# Patient Record
Sex: Female | Born: 1965 | Race: White | Hispanic: No | Marital: Married | Smoking: Current some day smoker
Health system: Southern US, Community
[De-identification: ages and names within clinical notes are randomized; demographics above are authoritative.]

## PROBLEM LIST (undated history)

## (undated) DIAGNOSIS — F419 Anxiety disorder, unspecified: Secondary | ICD-10-CM

## (undated) DIAGNOSIS — I10 Essential (primary) hypertension: Secondary | ICD-10-CM

## (undated) HISTORY — DX: Anxiety disorder, unspecified: F41.9

## (undated) HISTORY — DX: Essential (primary) hypertension: I10

---

## 1998-05-23 ENCOUNTER — Other Ambulatory Visit: Admission: RE | Admit: 1998-05-23 | Discharge: 1998-05-23 | Payer: Self-pay | Admitting: *Deleted

## 1999-05-28 ENCOUNTER — Other Ambulatory Visit: Admission: RE | Admit: 1999-05-28 | Discharge: 1999-05-28 | Payer: Self-pay | Admitting: *Deleted

## 1999-10-20 HISTORY — PX: ESSURE TUBAL LIGATION: SUR464

## 1999-11-27 ENCOUNTER — Inpatient Hospital Stay (HOSPITAL_COMMUNITY): Admission: AD | Admit: 1999-11-27 | Discharge: 1999-11-28 | Payer: Self-pay | Admitting: *Deleted

## 1999-11-27 ENCOUNTER — Encounter (INDEPENDENT_AMBULATORY_CARE_PROVIDER_SITE_OTHER): Payer: Self-pay

## 2001-11-04 ENCOUNTER — Encounter: Payer: Self-pay | Admitting: *Deleted

## 2001-11-04 ENCOUNTER — Emergency Department (HOSPITAL_COMMUNITY): Admission: EM | Admit: 2001-11-04 | Discharge: 2001-11-04 | Payer: Self-pay

## 2002-02-22 ENCOUNTER — Other Ambulatory Visit: Admission: RE | Admit: 2002-02-22 | Discharge: 2002-02-22 | Payer: Self-pay | Admitting: *Deleted

## 2003-03-09 ENCOUNTER — Other Ambulatory Visit: Admission: RE | Admit: 2003-03-09 | Discharge: 2003-03-09 | Payer: Self-pay | Admitting: *Deleted

## 2004-03-27 ENCOUNTER — Other Ambulatory Visit: Admission: RE | Admit: 2004-03-27 | Discharge: 2004-03-27 | Payer: Self-pay | Admitting: Obstetrics and Gynecology

## 2004-04-03 ENCOUNTER — Encounter: Admission: RE | Admit: 2004-04-03 | Discharge: 2004-04-03 | Payer: Self-pay | Admitting: Obstetrics and Gynecology

## 2004-11-11 ENCOUNTER — Encounter: Admission: RE | Admit: 2004-11-11 | Discharge: 2004-11-11 | Payer: Self-pay | Admitting: Obstetrics and Gynecology

## 2005-04-15 DIAGNOSIS — D229 Melanocytic nevi, unspecified: Secondary | ICD-10-CM

## 2005-04-15 HISTORY — DX: Melanocytic nevi, unspecified: D22.9

## 2005-05-11 ENCOUNTER — Other Ambulatory Visit: Admission: RE | Admit: 2005-05-11 | Discharge: 2005-05-11 | Payer: Self-pay | Admitting: Obstetrics and Gynecology

## 2008-03-15 ENCOUNTER — Ambulatory Visit (HOSPITAL_COMMUNITY): Admission: RE | Admit: 2008-03-15 | Discharge: 2008-03-15 | Payer: Self-pay | Admitting: Obstetrics and Gynecology

## 2015-02-22 ENCOUNTER — Other Ambulatory Visit: Payer: Self-pay | Admitting: Nurse Practitioner

## 2015-02-22 DIAGNOSIS — M25511 Pain in right shoulder: Secondary | ICD-10-CM

## 2015-03-04 ENCOUNTER — Other Ambulatory Visit: Payer: Self-pay

## 2016-12-29 ENCOUNTER — Emergency Department (HOSPITAL_COMMUNITY): Payer: Managed Care, Other (non HMO)

## 2016-12-29 ENCOUNTER — Emergency Department (HOSPITAL_COMMUNITY)
Admission: EM | Admit: 2016-12-29 | Discharge: 2016-12-29 | Disposition: A | Discharge status: Home / Self Care | Payer: Managed Care, Other (non HMO) | Attending: Emergency Medicine | Admitting: Emergency Medicine

## 2016-12-29 ENCOUNTER — Encounter (HOSPITAL_COMMUNITY): Payer: Self-pay | Admitting: *Deleted

## 2016-12-29 DIAGNOSIS — R079 Chest pain, unspecified: Secondary | ICD-10-CM | POA: Diagnosis present

## 2016-12-29 DIAGNOSIS — Z79899 Other long term (current) drug therapy: Secondary | ICD-10-CM | POA: Diagnosis not present

## 2016-12-29 DIAGNOSIS — F17200 Nicotine dependence, unspecified, uncomplicated: Secondary | ICD-10-CM | POA: Insufficient documentation

## 2016-12-29 DIAGNOSIS — R002 Palpitations: Secondary | ICD-10-CM | POA: Insufficient documentation

## 2016-12-29 DIAGNOSIS — R42 Dizziness and giddiness: Secondary | ICD-10-CM | POA: Diagnosis not present

## 2016-12-29 DIAGNOSIS — I10 Essential (primary) hypertension: Secondary | ICD-10-CM | POA: Insufficient documentation

## 2016-12-29 DIAGNOSIS — R072 Precordial pain: Secondary | ICD-10-CM | POA: Insufficient documentation

## 2016-12-29 LAB — CBC WITH DIFFERENTIAL/PLATELET
BASOS ABS: 0 10*3/uL (ref 0.0–0.1)
BASOS PCT: 0 %
Eosinophils Absolute: 0.1 10*3/uL (ref 0.0–0.7)
Eosinophils Relative: 1 %
HEMATOCRIT: 41.1 % (ref 36.0–46.0)
HEMOGLOBIN: 13.9 g/dL (ref 12.0–15.0)
Lymphocytes Relative: 29 %
Lymphs Abs: 3.2 10*3/uL (ref 0.7–4.0)
MCH: 32.3 pg (ref 26.0–34.0)
MCHC: 33.8 g/dL (ref 30.0–36.0)
MCV: 95.4 fL (ref 78.0–100.0)
MONO ABS: 0.8 10*3/uL (ref 0.1–1.0)
Monocytes Relative: 7 %
NEUTROS ABS: 7.1 10*3/uL (ref 1.7–7.7)
NEUTROS PCT: 63 %
Platelets: 320 10*3/uL (ref 150–400)
RBC: 4.31 MIL/uL (ref 3.87–5.11)
RDW: 12.4 % (ref 11.5–15.5)
WBC: 11.1 10*3/uL — AB (ref 4.0–10.5)

## 2016-12-29 LAB — BASIC METABOLIC PANEL
ANION GAP: 13 (ref 5–15)
BUN: <5 mg/dL — ABNORMAL LOW (ref 6–20)
CALCIUM: 9.5 mg/dL (ref 8.9–10.3)
CO2: 22 mmol/L (ref 22–32)
Chloride: 105 mmol/L (ref 101–111)
Creatinine, Ser: 0.47 mg/dL (ref 0.44–1.00)
Glucose, Bld: 99 mg/dL (ref 65–99)
Potassium: 3.7 mmol/L (ref 3.5–5.1)
SODIUM: 140 mmol/L (ref 135–145)

## 2016-12-29 LAB — I-STAT TROPONIN, ED
TROPONIN I, POC: 0 ng/mL (ref 0.00–0.08)
Troponin i, poc: 0 ng/mL (ref 0.00–0.08)

## 2016-12-29 MED ORDER — ASPIRIN 81 MG PO CHEW
324.0000 mg | CHEWABLE_TABLET | Freq: Once | ORAL | Status: AC
  Administered 2016-12-29: 324 mg via ORAL
  Filled 2016-12-29: qty 4

## 2016-12-29 MED ORDER — LORAZEPAM 0.5 MG PO TABS: 0.5000 mg | ORAL_TABLET | Freq: Three times a day (TID) | ORAL | 0 refills | Status: DC | PRN

## 2016-12-29 MED ORDER — SODIUM CHLORIDE 0.9 % IV BOLUS (SEPSIS)
500.0000 mL | Freq: Once | INTRAVENOUS | Status: AC
  Administered 2016-12-29: 500 mL via INTRAVENOUS

## 2016-12-29 NOTE — ED Triage Notes (Signed)
Patient presents to ed via [facility] states she hasn't been feeling well for several days, she has been taking her B/P at home and its been elevated. Today went to work started feeling bad her heart was racing so she went to Urgent Care.while waiting started having chest tightness and nausea, c/o sob. States upon ems chest tightness has subsided. States it feels a little tight now. States she has been under a lot of stress lately.

## 2016-12-29 NOTE — Discharge Instructions (Signed)

## 2016-12-29 NOTE — ED Notes (Signed)
Pt transported to Xray at this time.

## 2016-12-29 NOTE — ED Provider Notes (Signed)
Emergency Department Provider Note   I have reviewed the triage vital signs and the nursing notes.   HISTORY  Chief Complaint Chest Pain   HPI Brandy Valdez is a 51 y.o. female with no significant PMH resents to the emergency department for evaluation of heart palpitations, lightheadedness, nausea, chest pain. She reports going through a lot of stress with her family and health issues recently. She's been checking her blood pressure at home and is been elevated for several days. Today she went to work and suddenly felt a sense of a racing heart. She reports feeling generally fatigued along with palpitations and so went to urgent care. Awaiting in the waiting room she developed chest pressure in the center of her chest was nonradiating with associated nausea. She called 911 at that time and was transported to the emergency department. On arrival her chest pain had resolved but she continues to feel a palpitation sensation. She no longer has nausea. She is feeling slightly lightheaded with standing.    History reviewed. No pertinent past medical history.  There are no active problems to display for this patient.   History reviewed. No pertinent surgical history.    Allergies Penicillins  History reviewed. No pertinent family history.  Social History Social History  Substance Use Topics  . Smoking status: Current Some Day Smoker  . Smokeless tobacco: Never Used  . Alcohol use Yes    Review of Systems  10-point ROS otherwise negative.  ____________________________________________   PHYSICAL EXAM:  VITAL SIGNS: ED Triage Vitals [12/29/16 1303]  Enc Vitals Group     BP (!) 152/115     Pulse Rate 82     Resp 20     Temp 98.2 F (36.8 C)     Temp Source Oral     SpO2 100 %   Constitutional: Alert and oriented. Well appearing and in no acute distress. Eyes: Conjunctivae are normal.  Head: Atraumatic. Nose: No congestion/rhinnorhea. Mouth/Throat: Mucous  membranes are moist.  Neck: No stridor.   Cardiovascular: Normal rate, regular rhythm. Good peripheral circulation. Grossly normal heart sounds.   Respiratory: Normal respiratory effort.  No retractions. Lungs CTAB. Gastrointestinal: Soft and nontender. No distention.  Musculoskeletal: No lower extremity tenderness nor edema. No gross deformities of extremities. Neurologic:  Normal speech and language. No gross focal neurologic deficits are appreciated.  Skin:  Skin is warm, dry and intact. No rash noted. Psychiatric: Mood and affect are normal. Speech and behavior are normal.  ____________________________________________   LABS (all labs ordered are listed, but only abnormal results are displayed)  Labs Reviewed  BASIC METABOLIC PANEL - Abnormal; Notable for the following:       Result Value   BUN <5 (*)    All other components within normal limits  CBC WITH DIFFERENTIAL/PLATELET - Abnormal; Notable for the following:    WBC 11.1 (*)    All other components within normal limits  I-STAT TROPOININ, ED  I-STAT TROPOININ, ED   ____________________________________________  EKG   EKG Interpretation  Date/Time:  Tuesday December 29 2016 12:57:03 EDT Ventricular Rate:  76 PR Interval:    QRS Duration: 72 QT Interval:  372 QTC Calculation: 419 R Axis:   65 Text Interpretation:  Sinus rhythm Anterior infarct, old No STEMI. No prior for comparison.  Confirmed by LONG MD, JOSHUA [phone]) on 12/29/2016 12:59:21 PM       ____________________________________________  RADIOLOGY  Dg Chest 2 View  Result Date: 12/29/2016 CLINICAL DATA:  Hypertension and  tachycardia EXAM: CHEST  2 VIEW COMPARISON:  None. FINDINGS: Lungs are clear. Heart size and pulmonary vascularity are normal. No adenopathy. There is mild degenerative change in the thoracic spine. IMPRESSION: No edema or consolidation. Electronically Signed   By: Lowella Grip III M.D.   On: 12/29/2016 13:45     ____________________________________________   PROCEDURES  Procedure(s) performed:   Procedures  None ____________________________________________   INITIAL IMPRESSION / ASSESSMENT AND PLAN / ED COURSE  Pertinent labs & imaging results that were available during my care of the patient were reviewed by me and considered in my medical decision making (see chart for details).  Patient resents to the emergency department for evaluation of chest pain, nausea, fatigue, palpitations. She has largely unremarkable EKG on arrival with continued palpitation sensation. No chest pressure. Has elevated blood pressure of unclear significance. Plan for lab work including troponin, orthostatic vital signs, chest x-ray. Will give aspirin and IV fluids. HEART score 3. If enzymes are negative would consider trending and Cardiology follow up as an outpatient.   03:43 PM Updated patient regarding results. Plan for repeat troponin and reassess.   4:55 PM Repeat troponin negative. Patient's symptoms have resolved. Provided a small amount of ativan but discussed that Cardiology f/u and PCP evaluation is the manditory next step as I am not ready to blame this on anxiety. Patient verbalizes understanding. Provided contact information and written instructions for Cardiology f/u.   At this time, I do not feel there is any life-threatening condition present. I have reviewed and discussed all results (EKG, imaging, lab, urine as appropriate), exam findings with patient. I have reviewed nursing notes and appropriate previous records.  I feel the patient is safe to be discharged home without further emergent workup. Discussed usual and customary return precautions. Patient and family (if present) verbalize understanding and are comfortable with this plan.  Patient will follow-up with their primary care provider. If they do not have a primary care provider, information for follow-up has been provided to them. All  questions have been answered.  ____________________________________________  FINAL CLINICAL IMPRESSION(S) / ED DIAGNOSES  Final diagnoses:  Precordial chest pain  Heart palpitations  Lightheadedness     MEDICATIONS GIVEN DURING THIS VISIT:  Medications  sodium chloride 0.9 % bolus 500 mL (0 mLs Intravenous Stopped 12/29/16 1508)  aspirin chewable tablet 324 mg (324 mg Oral Given 12/29/16 1405)     NEW OUTPATIENT MEDICATIONS STARTED DURING THIS VISIT:  Discharge Medication List as of 12/29/2016  5:10 PM    START taking these medications   Details  LORazepam (ATIVAN) 0.5 MG tablet Take 1 tablet (0.5 mg total) by mouth every 8 (eight) hours as needed for anxiety., Starting Tue 12/29/2016, Print          Note:  This document was prepared using Dragon voice recognition software and may include unintentional dictation errors.  Nanda Quinton, MD Emergency Medicine   Margette Fast, MD 12/29/16 2037

## 2016-12-29 NOTE — ED Notes (Signed)
Pt stable, understands discharge instructions, and reasons for return.   

## 2017-01-01 ENCOUNTER — Encounter: Payer: Self-pay | Admitting: Physician Assistant

## 2017-01-01 ENCOUNTER — Ambulatory Visit (INDEPENDENT_AMBULATORY_CARE_PROVIDER_SITE_OTHER): Payer: Managed Care, Other (non HMO) | Admitting: Physician Assistant

## 2017-01-01 VITALS — BP 132/90 | HR 107 | Temp 99.7°F | Ht 66.0 in | Wt 188.2 lb

## 2017-01-01 DIAGNOSIS — I10 Essential (primary) hypertension: Secondary | ICD-10-CM | POA: Diagnosis not present

## 2017-01-01 DIAGNOSIS — F419 Anxiety disorder, unspecified: Secondary | ICD-10-CM | POA: Diagnosis not present

## 2017-01-01 DIAGNOSIS — J069 Acute upper respiratory infection, unspecified: Secondary | ICD-10-CM | POA: Diagnosis not present

## 2017-01-01 DIAGNOSIS — R002 Palpitations: Secondary | ICD-10-CM | POA: Diagnosis not present

## 2017-01-01 MED ORDER — DOXYCYCLINE HYCLATE 100 MG PO TABS: 100.0000 mg | ORAL_TABLET | Freq: Two times a day (BID) | ORAL | 0 refills | Status: DC

## 2017-01-01 MED ORDER — AMLODIPINE BESYLATE 2.5 MG PO TABS: 2.5000 mg | ORAL_TABLET | Freq: Every day | ORAL | 0 refills | Status: DC

## 2017-01-01 MED ORDER — SERTRALINE HCL 50 MG PO TABS: 50.0000 mg | ORAL_TABLET | Freq: Every day | ORAL | 0 refills | Status: DC

## 2017-01-01 NOTE — Progress Notes (Signed)
Subjective:    Patient ID: Brandy Valdez, female    DOB: 18-Mar-1966, 51 y.o.   MRN: [id number]  HPI  Brandy Valdez is a 52 y/o female who presents to clinic today to establish care.  Acute Concerns: Recent ED visit for chest pain -- Patient was recently seen in the emergency department on 12/29/2016 for evaluation of heart palpitations, nausea, chest pain, lightheadedness. Workup at that time revealed a normal BMP, normal CXR, slight elevation in white count of 11.1, and 2 normal troponin. EKG was unremarkable. She was given aspirin and IV fluids. ER recommendation included cardiology follow-up as an outpatient. She was given some Ativan for anxiety, however she reports that she was told to take it as needed and could possibly be addictive. Blood pressure was found to be 152/115. Since that time, has had some intermittent heart racing. She denies any concern for sleep apnea. Anxiety - She endorses significant stress and anxiety in her life, her husband was recently dx with a fib,  her father was recently dx with stroke and MI, and she has been dealing with legal issues for her father as well, her daughter has a horse that is not well. She states that this all started around Thanksgiving of last year. She does state that from 2000 11/20/2010 she was on Zoloft, was on this for a death of her child. She felt it was working well for her. Elevated blood pressure -- her blood pressures at home have been ranging from systolic 622W to 979 diastolic 89-211. She has a significant family history of hypertension. She denies headaches, lower leg swelling, SOB, any further chest pain. Cough and congestion --started yesterday, mild SOB, not taking anything for her symptoms, fatigue, low grade fever, she thinks that she pick something up at the hospital while she was visiting her husband and father, she has cough with mucus production  Chronic Issues: None  Health Maintenance: Immunizations -- up to  date Colonoscopy -- she like to address at next visit Mammogram -- we will request records from her OB/GYN who has been ordering these for patient PAP -- we will request records from her OB/GYN who has been ordering these for patient Diet -- cook healthy, limited fried foods Caffeine intake -- started drinking decaf tea, 4 cups of coffee daily x 12-16 oz Exercise -- walking with her husband, walk at work, help take care of horses with daughter Weight --   188 lb -- normal weight is 180 lb (1 year ago) Mood -- positive, no concerns for SI/HI  Other providers/specialists: None   Review of Systems  See HPI  Past Medical History:  Diagnosis Date  . Anxiety   . Hypertension      Social History   Social History  . Marital status: Married    Spouse name: N/A  . Number of children: N/A  . Years of education: N/A   Occupational History  . Not on file.   Social History Main Topics  . Smoking status: Current Some Day Smoker  . Smokeless tobacco: Never Used  . Alcohol use Yes     Comment: Occcassionally glass of wine  . Drug use: No  . Sexual activity: Yes    Birth control/ protection: Implant   Other Topics Concern  . Not on file   Social History Narrative   Part-time -- real estate office management   1 daughter -- at [employer], middle school; horseback lessons   Hx of child death  Past Surgical History:  Procedure Laterality Date  . ESSURE TUBAL LIGATION Bilateral 2001    Family History  Problem Relation Age of Onset  . Hypertension Mother   . Kidney cancer Mother   . Stroke Father   . Heart attack Father   . Hypertension Father   . Prostate cancer Father   . Hypertension Sister   . Diabetes Sister   . Hypertension Maternal Grandmother   . Breast cancer Maternal Grandmother   . Hypertension Maternal Grandfather   . Hypertension Paternal Grandmother   . Hypertension Paternal Grandfather   . Tuberculosis Paternal Grandfather   . Hypertension Sister      Allergies  Allergen Reactions  . Penicillins Rash    Has patient had a PCN reaction causing immediate rash, facial/tongue/throat swelling, SOB or lightheadedness with hypotension: Yes Has patient had a PCN reaction causing severe rash involving mucus membranes or skin necrosis: No Has patient had a PCN reaction that required hospitalization No Has patient had a PCN reaction occurring within the last 10 years: No If all of the above answers are "NO", then may proceed with Cephalosporin use.    Current Outpatient Prescriptions on File Prior to Visit  Medication Sig Dispense Refill  . ibuprofen (ADVIL,MOTRIN) 200 MG tablet Take 600 mg by mouth 2 (two) times daily as needed for headache (pain).    . LORazepam (ATIVAN) 0.5 MG tablet Take 1 tablet (0.5 mg total) by mouth every 8 (eight) hours as needed for anxiety. 5 tablet 0  . Melatonin 3 MG TABS Take 3 mg by mouth at bedtime.    . Multiple Vitamins-Minerals (EMERGEN-C VITAMIN C PO) Take 1 tablet by mouth See admin instructions. Take 1 tablet by mouth daily during cold and flu season to boost immune system    . oxymetazoline (AFRIN) 0.05 % nasal spray Place 2 sprays into both nostrils 2 (two) times daily.     No current facility-administered medications on file prior to visit.     BP 132/90 (BP Location: Left Arm, Patient Position: Sitting, Cuff Size: Normal)   Pulse (!) 107   Temp 99.7 F (37.6 C) (Oral)   Ht 5\' 6"  (1.676 m)   Wt 188 lb 4 oz (85.4 kg)   LMP 12/21/2016 Comment: [place]  SpO2 96%   BMI 30.38 kg/m      Objective:   Physical Exam  Constitutional: She appears well-developed and well-nourished. She is cooperative.  Non-toxic appearance. She does not have a sickly appearance. She does not appear ill. No distress.  HENT:  Head: Normocephalic and atraumatic.  Right Ear: Tympanic membrane, external ear and ear canal normal. Tympanic membrane is not erythematous, not retracted and not bulging.  Left Ear: Tympanic  membrane, external ear and ear canal normal. Tympanic membrane is not erythematous, not retracted and not bulging.  Nose: Nose normal. Right sinus exhibits no maxillary sinus tenderness and no frontal sinus tenderness. Left sinus exhibits no maxillary sinus tenderness and no frontal sinus tenderness.  Mouth/Throat: Uvula is midline and mucous membranes are normal. No oropharyngeal exudate, posterior oropharyngeal edema or posterior oropharyngeal erythema.  Cardiovascular: Regular rhythm and normal heart sounds.  Tachycardia present.   No lower extremity edema  Pulmonary/Chest: Effort normal and breath sounds normal. No accessory muscle usage. No respiratory distress. She has no decreased breath sounds. She has no wheezes. She has no rhonchi. She has no rales.  Dry cough throughout exam  Lymphadenopathy:    She has no cervical adenopathy.  Neurological:  She is alert.  Skin: Skin is warm, dry and intact.  Psychiatric: She has a normal mood and affect. Her speech is normal.  Nursing note and vitals reviewed.     Assessment & Plan:  1. Anxiety Reviewed labs ER. Labs were within normal limits. I will obtain the following labs today. Patient is agreeable to starting Zoloft 50 mg. She believes this is the dose that she was on in the past. She would like to stop using Ativan. I encouraged patient to decrease caffeine intake. Follow up with [place] in 2 weeks. - TSH - Magnesium - T4, free  2. Hypertension, unspecified type Given the blood pressure readings that she has from home as well as significant family history, I will start 2.5 mg Norvasc today. I gave patient a blood pressure log to keep her [place]. I would like for her to follow-up with [place] in 2 weeks to recheck her blood pressure. I would also like for her to establish with cardiology per the ER physician's recommendation. - Ambulatory referral to Cardiology  3. Palpitations I recommended patient decrease caffeine intake. I am checking thyroid and  magnesium labs. Referral to cardiology. I recommended that patient go to the ER if she develops any change in symptoms. - Ambulatory referral to Cardiology  4. Upper respiratory tract infection, unspecified type I suspect patient has likely viral infection. I gave her a safety net prescription of doxycycline given we're going into the weekend. I recommend that she follow-up with [place] if symptoms do not improve with treatment. Encourage rest and hydration.  Inda Coke PA-C 01/01/17

## 2017-01-01 NOTE — Progress Notes (Signed)
Pre visit review using our clinic review tool, if applicable. No additional management support is needed unless otherwise documented below in the visit note. 

## 2017-01-01 NOTE — Patient Instructions (Signed)
It was great meeting you!  We will call you with your lab results.  You will be contacted about your referral to cardiology.  If you develop any worsening cardiac symptoms, please go to the emergency room!   Please return in two weeks for BP recheck. Please bring your blood pressure log at that time.   Start zoloft for your anxiety and norvasc for your blood pressure.  You may take the doxycycline if your symptoms continue to worsen, especially if you develop fever or worsening cough. Please let us know if your symptoms do not improve despite treatment.

## 2017-01-02 LAB — MAGNESIUM: Magnesium: 1.7 mg/dL (ref 1.5–2.5)

## 2017-01-02 LAB — TSH: TSH: 0.41 mIU/L

## 2017-01-02 LAB — T4, FREE: Free T4: 1 ng/dL (ref 0.8–1.8)

## 2017-01-18 ENCOUNTER — Ambulatory Visit: Payer: Managed Care, Other (non HMO) | Admitting: Physician Assistant

## 2017-01-20 NOTE — Progress Notes (Signed)
Pre visit review using our clinic review tool, if applicable. No additional management support is needed unless otherwise documented below in the visit note. 

## 2017-01-20 NOTE — Progress Notes (Signed)
Brandy Valdez is a 51 y.o. female is here to follow up on Anxiety and Hypertension.  I acted as a Education administrator for [employer], PA-C Anselmo Pickler, LPN   History of Present Illness:   Chief Complaint  Patient presents with  . Follow-up  . Anxiety  . Hypertension    HPI   HTN -- patient is taking 2.5mg  Norvasc, tolerating well. BP logs show since starting medication, BP has been as low as 122/81 and as high as 161/96. Average appears to be around 140/95. Denies SOB, chest discomfort, blurred vision, HA, swelling in legs. BP Readings from Last 3 Encounters:  01/21/17 138/90  01/01/17 132/90  12/29/16 170/94   Anxiety -- patient feels as though her anxiety is much better since starting the 50mg  zoloft. She is sleeping better and feels more evened out.   Health Maintenance Due  Topic Date Due  . HIV Screening  05/26/1981  . PAP SMEAR  05/27/1987  . MAMMOGRAM  05/26/2016  . COLONOSCOPY  05/26/2016    PMHx, SurgHx, SocialHx, FamHx, Medications, and Allergies were reviewed in the Visit Navigator and updated as appropriate.   Patient Active Problem List   Diagnosis Date Noted  . Hypertension 01/21/2017  . Anxiety 01/21/2017    Social History  Substance Use Topics  . Smoking status: Current Some Day Smoker  . Smokeless tobacco: Never Used  . Alcohol use Yes     Comment: Occcassionally glass of wine    Current Medications and Allergies:    Current Outpatient Prescriptions:  .  ibuprofen (ADVIL,MOTRIN) 200 MG tablet, Take 600 mg by mouth 2 (two) times daily as needed for headache (pain)., Disp: , Rfl:  .  Melatonin 3 MG TABS, Take 3 mg by mouth at bedtime., Disp: , Rfl:  .  Multiple Vitamins-Minerals (EMERGEN-C VITAMIN C PO), Take 1 tablet by mouth See admin instructions. Take 1 tablet by mouth daily during cold and flu season to boost immune system, Disp: , Rfl:  .  oxymetazoline (AFRIN) 0.05 % nasal spray, Place 2 sprays into both nostrils 2 (two) times daily.,  Disp: , Rfl:  .  sertraline (ZOLOFT) 50 MG tablet, Take 1 tablet (50 mg total) by mouth daily., Disp: 90 tablet, Rfl: 1 .  amLODipine (NORVASC) 5 MG tablet, Take 1 tablet (5 mg total) by mouth daily., Disp: 30 tablet, Rfl: 0 .  LORazepam (ATIVAN) 0.5 MG tablet, Take 1 tablet (0.5 mg total) by mouth every 8 (eight) hours as needed for anxiety. (Patient not taking: Reported on 01/21/2017), Disp: 5 tablet, Rfl: 0   Allergies  Allergen Reactions  . Penicillins Rash    Has patient had a PCN reaction causing immediate rash, facial/tongue/throat swelling, SOB or lightheadedness with hypotension: Yes Has patient had a PCN reaction causing severe rash involving mucus membranes or skin necrosis: No Has patient had a PCN reaction that required hospitalization No Has patient had a PCN reaction occurring within the last 10 years: No If all of the above answers are "NO", then may proceed with Cephalosporin use.    Review of Systems   Review of Systems  Constitutional: Negative.   HENT: Negative.   Respiratory: Negative.   Cardiovascular: Negative.   Gastrointestinal: Positive for heartburn.       Some reflux  Genitourinary: Negative.   Musculoskeletal: Negative.   Skin: Negative.   Neurological: Negative.   Psychiatric/Behavioral: The patient is nervous/anxious.     Vitals:   Vitals:   01/21/17 1504  BP:  138/90  Pulse: 78  Temp: 98.6 F (37 C)  TempSrc: Oral  SpO2: 97%  Weight: 186 lb 8 oz (84.6 kg)  Height: 5\' 6"  (1.676 m)     Body mass index is 30.1 kg/m.   Physical Exam:    Physical Exam  Constitutional: She appears well-developed. She is cooperative.  Non-toxic appearance. She does not have a sickly appearance. She does not appear ill. No distress.  Cardiovascular: Normal rate, regular rhythm, S1 normal, S2 normal, normal heart sounds and normal pulses.   No LE edema  Pulmonary/Chest: Effort normal and breath sounds normal.  Neurological: She is alert.  Psychiatric: She  has a normal mood and affect. Her speech is normal and behavior is normal.  Nursing note and vitals reviewed.    Assessment and Plan:    Problem List Items Addressed This Visit      Cardiovascular and Mediastinum   Hypertension    Increase Norvasc to 5 mg. Has appointment with cardiology for initial evaluation soon, to help provide peace of mind. Continue to monitor blood pressure and call us if you have any concerning trends. Follow-up in 1 mont for nurse visit blood pressure check.      Relevant Medications   amLODipine (NORVASC) 5 MG tablet     Other   Anxiety - Primary    Well controlled on Zoloft 50mg . Continue.      Relevant Medications   sertraline (ZOLOFT) 50 MG tablet     . Reviewed expectations re: course of current medical issues. . Discussed self-management of symptoms. . Outlined signs and symptoms indicating need for more acute intervention. . Patient verbalized understanding and all questions were answered. . See orders for this visit as documented in the electronic medical record. . Patient received an After Visit Summary.   Inda Coke, PA-C [facility] 01/21/2017  Follow-up: No Follow-up on file.

## 2017-01-21 ENCOUNTER — Ambulatory Visit (INDEPENDENT_AMBULATORY_CARE_PROVIDER_SITE_OTHER): Payer: Managed Care, Other (non HMO) | Admitting: Physician Assistant

## 2017-01-21 ENCOUNTER — Encounter: Payer: Self-pay | Admitting: Physician Assistant

## 2017-01-21 VITALS — BP 138/90 | HR 78 | Temp 98.6°F | Ht 66.0 in | Wt 186.5 lb

## 2017-01-21 DIAGNOSIS — I10 Essential (primary) hypertension: Secondary | ICD-10-CM

## 2017-01-21 DIAGNOSIS — F419 Anxiety disorder, unspecified: Secondary | ICD-10-CM

## 2017-01-21 MED ORDER — SERTRALINE HCL 50 MG PO TABS: 50.0000 mg | ORAL_TABLET | Freq: Every day | ORAL | 1 refills | Status: DC

## 2017-01-21 MED ORDER — AMLODIPINE BESYLATE 5 MG PO TABS: 5.0000 mg | ORAL_TABLET | Freq: Every day | ORAL | 0 refills | Status: DC

## 2017-01-21 NOTE — Assessment & Plan Note (Signed)
Well controlled on Zoloft 50mg . Continue.

## 2017-01-21 NOTE — Patient Instructions (Signed)
It was great seeing you today!  Please make a nurse visit for 1 month to re-check you BP. Continue to monitor your BP at home, if you are concerned with your readings give [place] a call. Be sure to go to your cardiology appointment, I look forward to reading their note.  I have refilled your zoloft for 6 months.

## 2017-01-21 NOTE — Assessment & Plan Note (Addendum)
Increase Norvasc to 5 mg. Has appointment with cardiology for initial evaluation soon, to help provide peace of mind. Continue to monitor blood pressure and call us if you have any concerning trends. Follow-up in 1 mont for nurse visit blood pressure check.

## 2017-01-26 ENCOUNTER — Encounter: Payer: Self-pay | Admitting: Cardiology

## 2017-01-26 ENCOUNTER — Ambulatory Visit (INDEPENDENT_AMBULATORY_CARE_PROVIDER_SITE_OTHER): Payer: Managed Care, Other (non HMO) | Admitting: Cardiology

## 2017-01-26 DIAGNOSIS — I10 Essential (primary) hypertension: Secondary | ICD-10-CM

## 2017-01-26 DIAGNOSIS — R079 Chest pain, unspecified: Secondary | ICD-10-CM

## 2017-01-26 NOTE — Progress Notes (Signed)
PCP: Inda Coke, PA  Clinic Note: Chief Complaint  Patient presents with  . Hospitalization Follow-up    ER visit for CP     HPI: Brandy Valdez is a 51 y.o. female with a PMH below who presents today for Cardiology Consultation as part of Hospital f/u - presented with precordial CP.  She has been referred by Inda Coke, PA for evaluation of the chest pain symptoms and palpitation symptoms.  Recent Hospitalizations: ED visit 12/29/2016 - she noted heart palpitations and lightheadedness as well as nausea and some chest discomfort. He had been very stressed better blood pressure and her various family members health issues. She felt her heart racing and felt lightheaded. She then developed some sensation of nonradiating chest pressure /pain. By the time she was evaluated in the ER she was asymptomatic. She ruled out for MI and had a relatively normal EKG.  Studies Reviewed:  ER EKG: NSR, 76 bpm.  Artifact.  Poor R-wave progression / Septal Q waves - CRO Anterior MI, age undetermined.  Interval History: Caileigh presents today to follow-up from her ER visit. She has not had any further chest pain since the hospital stay. She has been active doing her daily living activities and has not had any further chest pain or palpitations. No dyspnea with rest or exertion. She has noted a significant amount of social stress with her family (her husband was just diagnosed with A. fib and has been in the hospital, and her father aged 61 has had a fall and suffered a minor stroke and MI). She thinks that the episode she had was probably related to anxiety attack. She has been started on sertraline and says that most were symptoms of improved. She actually notes that she's been more active than usual. She had one or 2 days of feeling a little bit weak, but has been doing fine since.   She has not noted any rapid irregular heartbeats palpitations. She has no problem with chest pain or shortness breath  going up and down stairs.  No PND, orthopnea or edema. No 60/near-syncope or TIA/amaurosis fugax symptoms.  ROS: A comprehensive was performed. Review of Systems  Constitutional: Negative for malaise/fatigue.  Respiratory: Negative for cough and shortness of breath.   Gastrointestinal: Positive for heartburn. Negative for abdominal pain, blood in stool, constipation and melena.  Genitourinary: Negative for hematuria.  Musculoskeletal: Negative for falls, joint pain and myalgias.  Neurological: Negative for dizziness, seizures and loss of consciousness.  Psychiatric/Behavioral: The patient is nervous/anxious (Symptoms notably improved with sertraline).   All other systems reviewed and are negative.   Past Medical History:  Diagnosis Date  . Anxiety   . Hypertension     Past Surgical History:  Procedure Laterality Date  . ESSURE TUBAL LIGATION Bilateral 2001    Current Meds  Medication Sig  . amLODipine (NORVASC) 5 MG tablet Take 1 tablet (5 mg total) by mouth daily.  Marland Kitchen ibuprofen (ADVIL,MOTRIN) 200 MG tablet Take 600 mg by mouth 2 (two) times daily as needed for headache (pain).  . LORazepam (ATIVAN) 0.5 MG tablet Take 1 tablet (0.5 mg total) by mouth every 8 (eight) hours as needed for anxiety.  . Melatonin 3 MG TABS Take 3 mg by mouth at bedtime.  . Multiple Vitamins-Minerals (EMERGEN-C VITAMIN C PO) Take 1 tablet by mouth See admin instructions. Take 1 tablet by mouth daily during cold and flu season to boost immune system  . oxymetazoline (AFRIN) 0.05 % nasal spray Place  2 sprays into both nostrils 2 (two) times daily.  . sertraline (ZOLOFT) 50 MG tablet Take 1 tablet (50 mg total) by mouth daily.    Allergies  Allergen Reactions  . Penicillins Rash    Has patient had a PCN reaction causing immediate rash, facial/tongue/throat swelling, SOB or lightheadedness with hypotension: Yes Has patient had a PCN reaction causing severe rash involving mucus membranes or skin necrosis:  No Has patient had a PCN reaction that required hospitalization No Has patient had a PCN reaction occurring within the last 10 years: No If all of the above answers are "NO", then may proceed with Cephalosporin use.    Social History   Social History  . Marital status: Married    Spouse name: N/A  . Number of children: N/A  . Years of education: N/A   Social History Main Topics  . Smoking status: Current Some Day Smoker  . Smokeless tobacco: Never Used  . Alcohol use Yes     Comment: Occcassionally glass of wine  . Drug use: No  . Sexual activity: Yes    Birth control/ protection: Implant   Other Topics Concern  . None   Social History Narrative   Part-time -- Banker   1 daughter -- at [employer], middle school; horseback lessons   Hx of child death    family history includes Breast cancer in her maternal grandmother; Diabetes in her sister; Heart attack in her father; Hypertension in her father, maternal grandfather, maternal grandmother, mother, paternal grandfather, paternal grandmother, sister, and sister; Kidney cancer in her mother; Prostate cancer in her father; Stroke in her father; Tuberculosis in her paternal grandfather.  Wt Readings from Last 3 Encounters:  01/26/17 186 lb 9.6 oz (84.6 kg)  01/21/17 186 lb 8 oz (84.6 kg)  01/01/17 188 lb 4 oz (85.4 kg)    PHYSICAL EXAM BP (!) 142/86   Pulse 81   Ht 5\' 7"  (1.702 m)   Wt 186 lb 9.6 oz (84.6 kg)   SpO2 94%   BMI 29.23 kg/m  General appearance: alert, cooperative, appears stated age, no distress and Borderline obese. Well-nourished and well-groomed. HEENT: [place]/AT, EOMI, MMM, anicteric sclera Neck: no adenopathy, no carotid bruit and no JVD Lungs: clear to auscultation bilaterally, normal percussion bilaterally and non-labored Heart: regular rate and rhythm, S1 & S2 normal, no murmur, click, rub or gallop; non-displaced PMI Abdomen: soft, non-tender; bowel sounds normal; no masses,  no  organomegaly Extremities: extremities normal, atraumatic, no cyanosis, or edema Pulses: 2+ and symmetric;  Skin: mobility and turgor normal, no edema, no evidence of bleeding or bruising and no lesions noted Neurologic: Mental status: Alert, oriented, thought content appropriate Cranial nerves: normal (II-XII grossly intact)    Adult ECG Report - not checked [facility] EKG reviewed.   Other studies Reviewed: Additional studies/ records that were reviewed today include:  Recent Labs:   Lab Results  Component Value Date   CREATININE 0.47 12/29/2016   BUN <5 (L) 12/29/2016   NA 140 12/29/2016   K 3.7 12/29/2016   CL 105 12/29/2016   CO2 22 12/29/2016     ASSESSMENT / PLAN: Problem List Items Addressed This Visit    Chest pain with low risk for cardiac etiology    Clinically, her presentation of chest pain was clearly not cardiac in nature. She's not having any more symptoms of chest pain or palpitations in the last few weeks since her ER visit. She remains active  and really is not having any anginal symptoms. I agree that her symptoms are probably consistent with anxiety given the amount of stress that she is under and the fact that she is doing better on sertraline.  I truthfully do not think that she needs a cardiac evaluation with stress test given the lack of any recurrent symptoms. Basically reassured her that her EKG in the hospital was okay and that evaluation the hospital was adequate.  - We discussed risk factor modification including BP control & smoking cessation.  Continued Diet & exercise.  If CP or palpitations recur - contact us for further evaluations.      Essential hypertension (Chronic)    Her Norvasc dose was just increased by PCP. This should help if there is any potential coronary spasm component of her pain, however I suspect that it is just simply essential hypertension and noncardiac chest pain.  Her blood pressure is a little bit above goal today, but  she is just increased her Norvasc dose. For now we will just continue current dose and monitor. Will defer to PCP.         Current medicines are reviewed at length with the patient today. (+/- concerns) n/a The following changes have been made: n/a  Patient Instructions  No change with treatment  Your physician recommends that you schedule a follow-up appointment on an as needed basis    Studies Ordered:   No orders of the defined types were placed in this encounter.     Glenetta Hew, M.D., M.S. Interventional Cardiologist   Pager # [phone] Phone # [phone] [address] [place], [place] [address]

## 2017-01-26 NOTE — Patient Instructions (Signed)
No change with treatment  Your physician recommends that you schedule a follow-up appointment on an as needed basis

## 2017-01-27 ENCOUNTER — Encounter: Payer: Self-pay | Admitting: Cardiology

## 2017-01-27 NOTE — Assessment & Plan Note (Signed)
Her Norvasc dose was just increased by PCP. This should help if there is any potential coronary spasm component of her pain, however I suspect that it is just simply essential hypertension and noncardiac chest pain.  Her blood pressure is a little bit above goal today, but she is just increased her Norvasc dose. For now we will just continue current dose and monitor. Will defer to PCP.

## 2017-01-27 NOTE — Assessment & Plan Note (Signed)
Clinically, her presentation of chest pain was clearly not cardiac in nature. She's not having any more symptoms of chest pain or palpitations in the last few weeks since her ER visit. She remains active and really is not having any anginal symptoms. I agree that her symptoms are probably consistent with anxiety given the amount of stress that she is under and the fact that she is doing better on sertraline.  I truthfully do not think that she needs a cardiac evaluation with stress test given the lack of any recurrent symptoms. Basically reassured her that her EKG in the hospital was okay and that evaluation the hospital was adequate.  - We discussed risk factor modification including BP control & smoking cessation.  Continued Diet & exercise.  If CP or palpitations recur - contact us for further evaluations.

## 2017-02-22 ENCOUNTER — Ambulatory Visit (INDEPENDENT_AMBULATORY_CARE_PROVIDER_SITE_OTHER): Payer: Managed Care, Other (non HMO) | Admitting: Physician Assistant

## 2017-02-22 ENCOUNTER — Encounter: Payer: Self-pay | Admitting: Physician Assistant

## 2017-02-22 VITALS — BP 160/100 | HR 82

## 2017-02-22 DIAGNOSIS — I10 Essential (primary) hypertension: Secondary | ICD-10-CM

## 2017-02-22 MED ORDER — AMLODIPINE BESYLATE 10 MG PO TABS: 10.0000 mg | ORAL_TABLET | Freq: Every day | ORAL | 0 refills | Status: DC

## 2017-02-22 NOTE — Progress Notes (Signed)
Pt presented to office for Bp check, blood pressure is still running high even with increase in medication Amlodipine to 5 mg daily.  Pt denies headaches or leg swelling. Pt has been keeping a log Bp has been as low as 123/85 and as high as 164/98.   Discussed pt with Aldona Bar, told her Bp was 160/100 and showed her pt's Bp log. Verbal order given to increase Amlodipine to 10 mg daily and Bp check in one month with nurse.  Told pt Aldona Bar is going to increase Amlodipine to 10 mg daily will send new Rx to pharmacy and follow up in one month again with nurse for Bp check. Pt verbalized understanding. New Bp log given to pt. Rx sent to pharmacy.

## 2017-02-26 NOTE — Progress Notes (Signed)
I have reviewed this information and agree with the information presented here.  Inda Coke PA-C 02/26/17

## 2017-03-25 ENCOUNTER — Ambulatory Visit (INDEPENDENT_AMBULATORY_CARE_PROVIDER_SITE_OTHER): Payer: Managed Care, Other (non HMO) | Admitting: Physician Assistant

## 2017-03-25 ENCOUNTER — Encounter: Payer: Self-pay | Admitting: Physician Assistant

## 2017-03-25 VITALS — BP 140/86 | HR 76

## 2017-03-25 DIAGNOSIS — I10 Essential (primary) hypertension: Secondary | ICD-10-CM

## 2017-03-25 MED ORDER — AMLODIPINE BESYLATE 10 MG PO TABS: 10.0000 mg | ORAL_TABLET | Freq: Every day | ORAL | 2 refills | Status: DC

## 2017-03-25 NOTE — Progress Notes (Signed)
I agree with the plan by Jari Sportsman, CMA.  Inda Coke PA-C

## 2017-03-25 NOTE — Progress Notes (Signed)
Pt presented to office for Bp check, blood pressure medication, Amlodipine 5mg  to 10mg  once per day.    Discussed pt with Aldona Bar, told her Bp was 140/86 and showed her pt's Bp log. Aldona Bar does not want to make any changes to medication at this time. Recommends a 3 month follow up.   Pt needs new prescription today sent to pharmacy. A 90 day supply has been sent to pharmacy.

## 2017-04-22 ENCOUNTER — Telehealth: Payer: Self-pay | Admitting: Physician Assistant

## 2017-04-22 MED ORDER — AMLODIPINE BESYLATE 10 MG PO TABS: 10.0000 mg | ORAL_TABLET | Freq: Every day | ORAL | 0 refills | Status: DC

## 2017-04-22 MED ORDER — SERTRALINE HCL 50 MG PO TABS: 50.0000 mg | ORAL_TABLET | Freq: Every day | ORAL | 0 refills | Status: DC

## 2017-04-22 NOTE — Telephone Encounter (Signed)
**  Remind patient they can make refill requests via MyChart**  Medication refill request (Name & Dosage): sertraline (ZOLOFT) 50 MG tablet  amLODipine (NORVASC) 10 MG tablet  Preferred pharmacy (Name & Address):  [facility]    Other comments (if applicable):   NOTIFY PATIENT ONCE RX [place]. THERE IS A [facility] FORM IN Inda Coke, PA FOLDER IN FRONT OFFICE.

## 2017-04-22 NOTE — Telephone Encounter (Signed)
Okay to refill medications per Aldona Bar. Rx sent to [facility]. Pt notified Rx's sent to [facility].

## 2017-06-29 ENCOUNTER — Encounter: Payer: Self-pay | Admitting: Physician Assistant

## 2017-06-29 ENCOUNTER — Ambulatory Visit (INDEPENDENT_AMBULATORY_CARE_PROVIDER_SITE_OTHER): Payer: Managed Care, Other (non HMO) | Admitting: Physician Assistant

## 2017-06-29 ENCOUNTER — Other Ambulatory Visit: Payer: Self-pay | Admitting: Physician Assistant

## 2017-06-29 VITALS — BP 130/86 | HR 73 | Temp 98.4°F | Ht 67.0 in | Wt 186.4 lb

## 2017-06-29 DIAGNOSIS — Z23 Encounter for immunization: Secondary | ICD-10-CM

## 2017-06-29 DIAGNOSIS — F419 Anxiety disorder, unspecified: Secondary | ICD-10-CM

## 2017-06-29 DIAGNOSIS — I10 Essential (primary) hypertension: Secondary | ICD-10-CM

## 2017-06-29 NOTE — Progress Notes (Signed)
Brandy Valdez is a 51 y.o. female is here to follow up on Hypertension and Anxiety.  I acted as a Education administrator for [employer], PA-C Anselmo Pickler, LPN  History of Present Illness:   Chief Complaint  Patient presents with  . Follow-up  . Hypertension  . Anxiety    Hypertension  This is a chronic problem. Episode onset: x 5 months, pt taking blood pressure at home, systolic 568'L, diastolic between 85 and 90. The problem has been gradually improving since onset. Associated symptoms include anxiety. Pertinent negatives include no blurred vision, chest pain, headaches, palpitations, peripheral edema or shortness of breath. Agents associated with hypertension include decongestants. Risk factors for coronary artery disease include smoking/tobacco exposure and stress (Father passed away 04-Jun-2023). The current treatment provides moderate improvement.  Anxiety  Presents for follow-up visit. Patient reports no chest pain, palpitations or shortness of breath. Symptoms occur occasionally (Pt has not had a panic attack since on Sertraline). The severity of symptoms is mild. The quality of sleep is good. Nighttime awakenings: one to two.   Compliance with medications is 76-100%.   Overall patient feels like she is doing very well.   GAD 7 : Generalized Anxiety Score 06/29/2017  Nervous, Anxious, on Edge 1  Control/stop worrying 1  Worry too much - different things 1  Trouble relaxing 1  Restless 1  Easily annoyed or irritable 1  Afraid - awful might happen 0  Total GAD 7 Score 6  Anxiety Difficulty Not difficult at all       Health Maintenance Due  Topic Date Due  . HIV Screening  05/26/1981  . PAP SMEAR  05/27/1987  . MAMMOGRAM  05/26/2016  . COLONOSCOPY  05/26/2016    Past Medical History:  Diagnosis Date  . Anxiety   . Hypertension      Social History   Social History  . Marital status: Married    Spouse name: N/A  . Number of children: N/A  . Years of education:  N/A   Occupational History  . Not on file.   Social History Main Topics  . Smoking status: Current Some Day Smoker  . Smokeless tobacco: Never Used  . Alcohol use Yes     Comment: Occcassionally glass of wine  . Drug use: No  . Sexual activity: Yes    Birth control/ protection: Implant   Other Topics Concern  . Not on file   Social History Narrative   Part-time -- real estate office management   1 daughter -- at [employer], middle school; horseback lessons   Hx of child death    Past Surgical History:  Procedure Laterality Date  . ESSURE TUBAL LIGATION Bilateral 2001    Family History  Problem Relation Age of Onset  . Hypertension Mother   . Kidney cancer Mother   . Stroke Father   . Heart attack Father   . Hypertension Father   . Prostate cancer Father   . Hypertension Sister   . Diabetes Sister   . Hypertension Maternal Grandmother   . Breast cancer Maternal Grandmother   . Hypertension Maternal Grandfather   . Hypertension Paternal Grandmother   . Hypertension Paternal Grandfather   . Tuberculosis Paternal Grandfather   . Hypertension Sister     PMHx, SurgHx, SocialHx, FamHx, Medications, and Allergies were reviewed in the Visit Navigator and updated as appropriate.   Patient Active Problem List   Diagnosis Date Noted  . Chest pain with low risk for  cardiac etiology 01/26/2017  . Essential hypertension 01/21/2017  . Anxiety 01/21/2017    Social History  Substance Use Topics  . Smoking status: Current Some Day Smoker  . Smokeless tobacco: Never Used  . Alcohol use Yes     Comment: Occcassionally glass of wine    Current Medications and Allergies:    Current Outpatient Prescriptions:  .  ibuprofen (ADVIL,MOTRIN) 200 MG tablet, Take 600 mg by mouth 2 (two) times daily as needed for headache (pain)., Disp: , Rfl:  .  LORazepam (ATIVAN) 0.5 MG tablet, Take 1 tablet (0.5 mg total) by mouth every 8 (eight) hours as needed for anxiety., Disp: 5 tablet,  Rfl: 0 .  Melatonin 3 MG TABS, Take 3 mg by mouth at bedtime., Disp: , Rfl:  .  Multiple Vitamins-Minerals (EMERGEN-C VITAMIN C PO), Take 1 tablet by mouth See admin instructions. Take 1 tablet by mouth daily during cold and flu season to boost immune system, Disp: , Rfl:  .  oxymetazoline (AFRIN) 0.05 % nasal spray, Place 2 sprays into both nostrils 2 (two) times daily., Disp: , Rfl:  .  amLODipine (NORVASC) 10 MG tablet, TAKE 1 TABLET DAILY, Disp: 90 tablet, Rfl: 1 .  sertraline (ZOLOFT) 50 MG tablet, TAKE 1 TABLET DAILY, Disp: 90 tablet, Rfl: 1   Allergies  Allergen Reactions  . Penicillins Rash    Has patient had a PCN reaction causing immediate rash, facial/tongue/throat swelling, SOB or lightheadedness with hypotension: Yes Has patient had a PCN reaction causing severe rash involving mucus membranes or skin necrosis: No Has patient had a PCN reaction that required hospitalization No Has patient had a PCN reaction occurring within the last 10 years: No If all of the above answers are "NO", then may proceed with Cephalosporin use.    Review of Systems   Review of Systems  Eyes: Negative for blurred vision.  Respiratory: Negative for shortness of breath.   Cardiovascular: Negative for chest pain and palpitations.  Neurological: Negative for headaches.    Vitals:   Vitals:   06/29/17 1353  BP: 130/86  Pulse: 73  Temp: 98.4 F (36.9 C)  TempSrc: Oral  SpO2: 97%  Weight: 186 lb 6.1 oz (84.5 kg)  Height: 5\' 7"  (1.702 m)     Body mass index is 29.19 kg/m.   Physical Exam:    Physical Exam  Constitutional: She appears well-developed. She is cooperative.  Non-toxic appearance. She does not have a sickly appearance. She does not appear ill. No distress.  Cardiovascular: Normal rate, regular rhythm, S1 normal, S2 normal, normal heart sounds and normal pulses.   No LE edema  Pulmonary/Chest: Effort normal and breath sounds normal.  Neurological: She is alert. GCS eye  subscore is 4. GCS verbal subscore is 5. GCS motor subscore is 6.  Skin: Skin is warm, dry and intact.  Psychiatric: She has a normal mood and affect. Her speech is normal and behavior is normal.  Nursing note and vitals reviewed.    Assessment and Plan:    Azya was seen today for follow-up, hypertension and anxiety.  Diagnoses and all orders for this visit:  Hypertension, unspecified type Refill Norvasc 10 mg x 6 months. Follow-up with [place] at that time. Advised patient to keep an eye on blood pressure - if consistently >140/90, to please let me know.  Need for prophylactic vaccination and inoculation against influenza -     Flu Vaccine QUAD 36+ mos IM  Anxiety GAD-7 today is 6. Refill  Zoloft 50 mg x 6 months. Follow-up with [place] at that time, sooner if needed.   . Reviewed expectations re: course of current medical issues. . Discussed self-management of symptoms. . Outlined signs and symptoms indicating need for more acute intervention. . Patient verbalized understanding and all questions were answered. . See orders for this visit as documented in the electronic medical record. . Patient received an After Visit Summary.  CMA or LPN served as scribe during this visit. History, Physical, and Plan performed by medical provider. Documentation and orders reviewed and attested to.  Inda Coke, PA-C [place] [place] 06/29/2017  Follow-up: No Follow-up on file.

## 2017-10-28 ENCOUNTER — Telehealth: Payer: Self-pay | Admitting: Physician Assistant

## 2017-10-28 MED ORDER — AMLODIPINE BESYLATE 10 MG PO TABS: 10.0000 mg | ORAL_TABLET | Freq: Every day | ORAL | 1 refills | Status: DC

## 2017-10-28 MED ORDER — SERTRALINE HCL 50 MG PO TABS: 50.0000 mg | ORAL_TABLET | Freq: Every day | ORAL | 1 refills | Status: DC

## 2017-10-28 NOTE — Telephone Encounter (Signed)
Copied from [place] [phone]. Topic: Quick Communication - Rx Refill/Question >> Oct 28, 2017 11:02 AM Synthia Innocent wrote: Medication: amLODipine (NORVASC) 10 MG tablet and sertraline (ZOLOFT) 50 MG tablet    Has the patient contacted their pharmacy? Yes. , new pharmacy   (Agent: If no, request that the patient contact the pharmacy for the refill.)   Preferred Pharmacy (with phone number or street name):[facility] on [address]   Agent: Please be advised that RX refills may take up to 3 business days. We ask that you follow-up with your pharmacy.

## 2017-10-28 NOTE — Telephone Encounter (Signed)
Amlodipine LR 06/29/17 90# 1 refill OV: 06/29/17 Sertraline LR: 06/29/17 90# 1 refill OV :06/29/17 Pharmacy changed to [facility]

## 2017-12-27 ENCOUNTER — Encounter: Payer: Self-pay | Admitting: Physician Assistant

## 2017-12-27 ENCOUNTER — Ambulatory Visit: Payer: No Typology Code available for payment source | Admitting: Physician Assistant

## 2017-12-27 VITALS — BP 140/98 | HR 79 | Temp 98.3°F | Ht 67.0 in | Wt 195.5 lb

## 2017-12-27 DIAGNOSIS — I10 Essential (primary) hypertension: Secondary | ICD-10-CM

## 2017-12-27 DIAGNOSIS — F419 Anxiety disorder, unspecified: Secondary | ICD-10-CM

## 2017-12-27 MED ORDER — SERTRALINE HCL 50 MG PO TABS: 50.0000 mg | ORAL_TABLET | Freq: Every day | ORAL | 1 refills | Status: DC

## 2017-12-27 MED ORDER — AMLODIPINE BESYLATE 10 MG PO TABS: 10.0000 mg | ORAL_TABLET | Freq: Every day | ORAL | 1 refills | Status: DC

## 2017-12-27 NOTE — Patient Instructions (Signed)
Please return for a physical exam, we at least need to do a fasting lipid panel.   It's best to make your follow-up appointment with me in the morning. We will draw a fasting lab at that visit. After midnight on the day of our next visit, please do not eat anything. You may have water, black coffee, unsweetened tea.

## 2017-12-27 NOTE — Progress Notes (Signed)
Brandy Valdez is a 52 y.o. female is here to discuss: Hypertension and Anxiety.  I acted as a Education administrator for [employer], PA-C Anselmo Pickler, LPN  History of Present Illness:   Chief Complaint  Patient presents with  . Hypertension  . Anxiety    Hypertension  This is a chronic problem. Episode onset: Pt her for blood pressure follow up, Bp running on average 130/80. The problem is controlled. Associated symptoms include anxiety. Pertinent negatives include no blurred vision, chest pain, headaches, malaise/fatigue, neck pain, palpitations, peripheral edema or shortness of breath. The current treatment provides moderate improvement. There are no compliance problems.   Anxiety  Presents for follow-up visit. Patient reports no chest pain, confusion, decreased concentration, depressed mood, dizziness, dry mouth, excessive worry, insomnia, irritability, malaise, muscle tension, nausea, nervous/anxious behavior, palpitations, panic, restlessness, shortness of breath or suicidal ideas. Symptoms occur rarely. Hours of sleep per night: 7 -8 hours of sleep. The quality of sleep is good. Nighttime awakenings: one to two.   Compliance with medications is 76-100%. Treatment side effects: Pt is tolerating medication no side effects.   Doing well with a fresh start for 2019 -- feeling much better!    GAD 7 : Generalized Anxiety Score 12/27/2017 06/29/2017  Nervous, Anxious, on Edge 0 1  Control/stop worrying 0 1  Worry too much - different things 0 1  Trouble relaxing 1 1  Restless 0 1  Easily annoyed or irritable 0 1  Afraid - awful might happen 0 0  Total GAD 7 Score 1 6  Anxiety Difficulty - Not difficult at all      Health Maintenance Due  Topic Date Due  . HIV Screening  05/26/1981  . PAP SMEAR  05/27/1987  . MAMMOGRAM  05/26/2016  . COLONOSCOPY  05/26/2016    Past Medical History:  Diagnosis Date  . Anxiety   . Hypertension      Social History   Socioeconomic History    . Marital status: Married    Spouse name: Not on file  . Number of children: Not on file  . Years of education: Not on file  . Highest education level: Not on file  Social Needs  . Financial resource strain: Not on file  . Food insecurity - worry: Not on file  . Food insecurity - inability: Not on file  . Transportation needs - medical: Not on file  . Transportation needs - non-medical: Not on file  Occupational History  . Not on file  Tobacco Use  . Smoking status: Current Some Day Smoker  . Smokeless tobacco: Never Used  Substance and Sexual Activity  . Alcohol use: Yes    Comment: Occcassionally glass of wine  . Drug use: No  . Sexual activity: Yes    Birth control/protection: Implant  Other Topics Concern  . Not on file  Social History Narrative   Part-time -- real estate office management   1 daughter -- at [employer], middle school; horseback lessons   Hx of child death    Past Surgical History:  Procedure Laterality Date  . ESSURE TUBAL LIGATION Bilateral 2001    Family History  Problem Relation Age of Onset  . Hypertension Mother   . Kidney cancer Mother   . Stroke Father   . Heart attack Father   . Hypertension Father   . Prostate cancer Father   . Hypertension Sister   . Diabetes Sister   . Hypertension Maternal Grandmother   . Breast  cancer Maternal Grandmother   . Hypertension Maternal Grandfather   . Hypertension Paternal Grandmother   . Hypertension Paternal Grandfather   . Tuberculosis Paternal Grandfather   . Hypertension Sister     PMHx, SurgHx, SocialHx, FamHx, Medications, and Allergies were reviewed in the Visit Navigator and updated as appropriate.   Patient Active Problem List   Diagnosis Date Noted  . Chest pain with low risk for cardiac etiology 01/26/2017  . Essential hypertension 01/21/2017  . Anxiety 01/21/2017    Social History   Tobacco Use  . Smoking status: Current Some Day Smoker  . Smokeless tobacco: Never Used   Substance Use Topics  . Alcohol use: Yes    Comment: Occcassionally glass of wine  . Drug use: No    Current Medications and Allergies:    Current Outpatient Medications:  .  amLODipine (NORVASC) 10 MG tablet, Take 1 tablet (10 mg total) by mouth daily., Disp: 90 tablet, Rfl: 1 .  ibuprofen (ADVIL,MOTRIN) 200 MG tablet, Take 600 mg by mouth 2 (two) times daily as needed for headache (pain)., Disp: , Rfl:  .  Melatonin 3 MG TABS, Take 3 mg by mouth at bedtime., Disp: , Rfl:  .  Multiple Vitamins-Minerals (EMERGEN-C VITAMIN C PO), Take 1 tablet by mouth See admin instructions. Take 1 tablet by mouth daily during cold and flu season to boost immune system, Disp: , Rfl:  .  oxymetazoline (AFRIN) 0.05 % nasal spray, Place 2 sprays into both nostrils 2 (two) times daily., Disp: , Rfl:  .  sertraline (ZOLOFT) 50 MG tablet, Take 1 tablet (50 mg total) by mouth daily., Disp: 90 tablet, Rfl: 1   Allergies  Allergen Reactions  . Penicillins Rash    Has patient had a PCN reaction causing immediate rash, facial/tongue/throat swelling, SOB or lightheadedness with hypotension: Yes Has patient had a PCN reaction causing severe rash involving mucus membranes or skin necrosis: No Has patient had a PCN reaction that required hospitalization No Has patient had a PCN reaction occurring within the last 10 years: No If all of the above answers are "NO", then may proceed with Cephalosporin use.    Review of Systems   Review of Systems  Constitutional: Negative for irritability and malaise/fatigue.  Eyes: Negative for blurred vision.  Respiratory: Negative for shortness of breath.   Cardiovascular: Negative for chest pain and palpitations.  Gastrointestinal: Negative for nausea.  Musculoskeletal: Negative for neck pain.  Neurological: Negative for dizziness and headaches.  Psychiatric/Behavioral: Negative for confusion, decreased concentration and suicidal ideas. The patient is not nervous/anxious and  does not have insomnia.     Vitals:   Vitals:   12/27/17 1338  BP: (!) 140/98  Pulse: 79  Temp: 98.3 F (36.8 C)  TempSrc: Oral  SpO2: 98%  Weight: 195 lb 8 oz (88.7 kg)  Height: 5\' 7"  (1.702 m)     Body mass index is 30.62 kg/m.   Physical Exam:    Physical Exam  Constitutional: She appears well-developed. She is cooperative.  Non-toxic appearance. She does not have a sickly appearance. She does not appear ill. No distress.  Cardiovascular: Normal rate, regular rhythm, S1 normal, S2 normal, normal heart sounds and normal pulses.  No LE edema  Pulmonary/Chest: Effort normal and breath sounds normal.  Neurological: She is alert. GCS eye subscore is 4. GCS verbal subscore is 5. GCS motor subscore is 6.  Skin: Skin is warm, dry and intact.  Psychiatric: She has a normal mood and  affect. Her speech is normal and behavior is normal.  Pleasant and cheerful  Nursing note and vitals reviewed.    Assessment and Plan:    Nimah was seen today for hypertension and anxiety.  Diagnoses and all orders for this visit:  Hypertension, unspecified type Doing well on Norvasc 10 mg. Continue this. I did recommend that when she follow-up in 6 months we check a lipid panel. Follow-up if checked blood pressures are consistently out of range. Patient verbalized understanding.  Anxiety GAD-7 down from 6 (prior visit) to 1 today. She is happy with her zoloft 50 mg dosage. Tolerating well with no side effects. Will continue this medication at this time.   Other orders -     amLODipine (NORVASC) 10 MG tablet; Take 1 tablet (10 mg total) by mouth daily. -     sertraline (ZOLOFT) 50 MG tablet; Take 1 tablet (50 mg total) by mouth daily.    . Reviewed expectations re: course of current medical issues. . Discussed self-management of symptoms. . Outlined signs and symptoms indicating need for more acute intervention. . Patient verbalized understanding and all questions were answered. . See  orders for this visit as documented in the electronic medical record. . Patient received an After Visit Summary.  CMA or LPN served as scribe during this visit. History, Physical, and Plan performed by medical provider. Documentation and orders reviewed and attested to.  Inda Coke, PA-C [place] 12/27/2017  Follow-up: Return in about 6 months (around 06/29/2018) for medication f/u.

## 2018-01-31 DIAGNOSIS — C4492 Squamous cell carcinoma of skin, unspecified: Secondary | ICD-10-CM

## 2018-01-31 HISTORY — DX: Squamous cell carcinoma of skin, unspecified: C44.92

## 2018-04-27 ENCOUNTER — Other Ambulatory Visit: Payer: Self-pay | Admitting: Physician Assistant

## 2018-05-16 ENCOUNTER — Ambulatory Visit (INDEPENDENT_AMBULATORY_CARE_PROVIDER_SITE_OTHER): Payer: No Typology Code available for payment source | Admitting: Physician Assistant

## 2018-05-16 ENCOUNTER — Encounter: Payer: Self-pay | Admitting: Physician Assistant

## 2018-05-16 VITALS — BP 138/86 | HR 73 | Temp 98.6°F | Ht 66.5 in | Wt 196.2 lb

## 2018-05-16 DIAGNOSIS — E669 Obesity, unspecified: Secondary | ICD-10-CM

## 2018-05-16 DIAGNOSIS — Z720 Tobacco use: Secondary | ICD-10-CM | POA: Diagnosis not present

## 2018-05-16 DIAGNOSIS — E785 Hyperlipidemia, unspecified: Secondary | ICD-10-CM | POA: Insufficient documentation

## 2018-05-16 DIAGNOSIS — Z136 Encounter for screening for cardiovascular disorders: Secondary | ICD-10-CM | POA: Diagnosis not present

## 2018-05-16 DIAGNOSIS — Z1211 Encounter for screening for malignant neoplasm of colon: Secondary | ICD-10-CM

## 2018-05-16 DIAGNOSIS — I10 Essential (primary) hypertension: Secondary | ICD-10-CM

## 2018-05-16 DIAGNOSIS — F419 Anxiety disorder, unspecified: Secondary | ICD-10-CM | POA: Diagnosis not present

## 2018-05-16 DIAGNOSIS — Z0000 Encounter for general adult medical examination without abnormal findings: Secondary | ICD-10-CM | POA: Diagnosis not present

## 2018-05-16 DIAGNOSIS — Z13220 Encounter for screening for lipoid disorders: Secondary | ICD-10-CM

## 2018-05-16 LAB — COMPREHENSIVE METABOLIC PANEL
ALT: 19 U/L (ref 0–35)
AST: 16 U/L (ref 0–37)
Albumin: 4.4 g/dL (ref 3.5–5.2)
Alkaline Phosphatase: 63 U/L (ref 39–117)
BILIRUBIN TOTAL: 0.3 mg/dL (ref 0.2–1.2)
BUN: 10 mg/dL (ref 6–23)
CO2: 27 meq/L (ref 19–32)
CREATININE: 0.5 mg/dL (ref 0.40–1.20)
Calcium: 9.3 mg/dL (ref 8.4–10.5)
Chloride: 102 mEq/L (ref 96–112)
GFR: 137.72 mL/min (ref >60.00)
GLUCOSE: 93 mg/dL (ref 70–99)
Potassium: 3.6 mEq/L (ref 3.5–5.1)
SODIUM: 138 meq/L (ref 135–145)
Total Protein: 7 g/dL (ref 6.0–8.3)

## 2018-05-16 LAB — LIPID PANEL
Cholesterol: 242 mg/dL — ABNORMAL HIGH (ref 0–200)
HDL: 41.4 mg/dL (ref >39.00)
NONHDL: 200.43
Total CHOL/HDL Ratio: 6
Triglycerides: 291 mg/dL — ABNORMAL HIGH (ref 0.0–149.0)
VLDL: 58.2 mg/dL — ABNORMAL HIGH (ref 0.0–40.0)

## 2018-05-16 LAB — CBC WITH DIFFERENTIAL/PLATELET
BASOS ABS: 0.1 10*3/uL (ref 0.0–0.1)
Basophils Relative: 0.7 % (ref 0.0–3.0)
EOS ABS: 0.2 10*3/uL (ref 0.0–0.7)
Eosinophils Relative: 2.2 % (ref 0.0–5.0)
HCT: 41.2 % (ref 36.0–46.0)
Hemoglobin: 14.2 g/dL (ref 12.0–15.0)
LYMPHS PCT: 42.8 % (ref 12.0–46.0)
Lymphs Abs: 3.6 10*3/uL (ref 0.7–4.0)
MCHC: 34.6 g/dL (ref 30.0–36.0)
MCV: 96.9 fl (ref 78.0–100.0)
MONOS PCT: 7.9 % (ref 3.0–12.0)
Monocytes Absolute: 0.7 10*3/uL (ref 0.1–1.0)
NEUTROS PCT: 46.4 % (ref 43.0–77.0)
Neutro Abs: 3.9 10*3/uL (ref 1.4–7.7)
PLATELETS: 283 10*3/uL (ref 150.0–400.0)
RBC: 4.25 Mil/uL (ref 3.87–5.11)
RDW: 12.7 % (ref 11.5–15.5)
WBC: 8.3 10*3/uL (ref 4.0–10.5)

## 2018-05-16 LAB — LDL CHOLESTEROL, DIRECT: LDL DIRECT: 158 mg/dL

## 2018-05-16 MED ORDER — SERTRALINE HCL 50 MG PO TABS: 50.0000 mg | ORAL_TABLET | Freq: Every day | ORAL | 1 refills | Status: DC

## 2018-05-16 MED ORDER — AMLODIPINE BESYLATE 10 MG PO TABS: 10.0000 mg | ORAL_TABLET | Freq: Every day | ORAL | 1 refills | Status: DC

## 2018-05-16 NOTE — Progress Notes (Signed)
I acted as a Education administrator for [employer], PA-C Anselmo Pickler, LPN  Subjective:    Brandy Valdez is a 52 y.o. female and is here for a comprehensive physical exam.   HPI  Health Maintenance Due  Topic Date Due  . COLONOSCOPY  05/26/2016    Acute Concerns: None  Chronic Issues: HTN -- Currently taking Norvasc 10 mg. At home blood pressure readings are: 130/80. Patient denies chest pain, SOB, blurred vision, dizziness, unusual headaches, lower leg swelling. Patient is compliant with medication. Denies excessive caffeine intake, stimulant usage, excessive alcohol intake, or increase in salt consumption. Anxiety -- doing well on Zoloft 50 mg daily. Takes as prescribed. Tobacco abuse -- socially and for anxiety (pack of cigarettes can last anywhere from 3-5 days)  Health Maintenance: Immunizations -- UTD Colonoscopy -- willing to schedule later this year Mammogram -- going to ob-gyn soon, will request records PAP -- going to ob-gyn soon, will request records Bone Density -- n/a Diet -- eats healthy but portions are large; has two sodas a week (Tab) Caffeine intake -- coffee in AM Sleep habits -- melatonin works to help her sleep Exercise -- working on exercise, 300 steps an hour Weight -- Weight: 196 lb 4 oz (89 kg)  Mood -- good Weight history: Wt Readings from Last 10 Encounters:  05/16/18 196 lb 4 oz (89 kg)  12/27/17 195 lb 8 oz (88.7 kg)  06/29/17 186 lb 6.1 oz (84.5 kg)  01/26/17 186 lb 9.6 oz (84.6 kg)  01/21/17 186 lb 8 oz (84.6 kg)  01/01/17 188 lb 4 oz (85.4 kg)   Patient's last menstrual period was 04/11/2018. Period characteristics: irregular and light Alcohol use: occasional wine  Depression screen PHQ 2/9 05/16/2018  Decreased Interest 0  Down, Depressed, Hopeless 0  PHQ - 2 Score 0     Other providers/specialists: Needs to see dentist UTD with eye doctor   PMHx, SurgHx, SocialHx, Medications, and Allergies were reviewed in the Visit Navigator  and updated as appropriate.   Past Medical History:  Diagnosis Date  . Anxiety   . Hypertension      Past Surgical History:  Procedure Laterality Date  . ESSURE TUBAL LIGATION Bilateral 2001     Family History  Problem Relation Age of Onset  . Hypertension Mother   . Kidney cancer Mother   . Stroke Father   . Heart attack Father   . Hypertension Father   . Prostate cancer Father   . Hypertension Sister   . Diabetes Sister   . Hypertension Maternal Grandmother   . Breast cancer Maternal Grandmother   . Hypertension Maternal Grandfather   . Hypertension Paternal Grandmother   . Hypertension Paternal Grandfather   . Tuberculosis Paternal Grandfather   . Hypertension Sister     Social History   Tobacco Use  . Smoking status: Current Some Day Smoker  . Smokeless tobacco: Never Used  Substance Use Topics  . Alcohol use: Yes    Comment: Occcassionally glass of wine  . Drug use: No    Review of Systems:   Review of Systems  Constitutional: Negative.  Negative for chills, fever, malaise/fatigue and weight loss.  HENT: Negative.  Negative for hearing loss, sinus pain and sore throat.   Eyes: Negative.  Negative for blurred vision.  Respiratory: Negative.  Negative for cough and shortness of breath.   Cardiovascular: Negative.  Negative for chest pain, palpitations and leg swelling.  Gastrointestinal: Negative.  Negative for abdominal pain, constipation,  diarrhea, heartburn, nausea and vomiting.  Genitourinary: Negative.  Negative for dysuria, frequency and urgency.  Musculoskeletal: Negative.  Negative for back pain, myalgias and neck pain.  Skin: Negative.  Negative for itching and rash.  Neurological: Negative.  Negative for dizziness, tingling, seizures, loss of consciousness and headaches.  Endo/Heme/Allergies: Negative.  Negative for polydipsia.  Psychiatric/Behavioral: Negative for depression. The patient is nervous/anxious.     Objective:   BP 138/86 (BP  Location: Left Arm, Patient Position: Sitting, Cuff Size: Normal)   Pulse 73   Temp 98.6 F (37 C) (Oral)   Ht 5' 6.5" (1.689 m)   Wt 196 lb 4 oz (89 kg)   LMP 04/11/2018   SpO2 97%   BMI 31.20 kg/m  Body mass index is 31.2 kg/m.   General Appearance:    Alert, cooperative, no distress, appears stated age  Head:    Normocephalic, without obvious abnormality, atraumatic  Eyes:    PERRL, conjunctiva/corneas clear, EOM's intact, fundi    benign, both eyes  Ears:    Normal TM's and external ear canals, both ears  Nose:   Nares normal, septum midline, mucosa normal, no drainage    or sinus tenderness  Throat:   Lips, mucosa, and tongue normal; teeth and gums normal  Neck:   Supple, symmetrical, trachea midline, no adenopathy;    thyroid:  no enlargement/tenderness/nodules; no carotid   bruit or JVD  Back:     Symmetric, no curvature, ROM normal, no CVA tenderness  Lungs:     Clear to auscultation bilaterally, respirations unlabored  Chest Wall:    No tenderness or deformity   Heart:    Regular rate and rhythm, S1 and S2 normal, no murmur, rub   or gallop  Breast Exam:    Deferred  Abdomen:     Soft, non-tender, bowel sounds active all four quadrants,    no masses, no organomegaly  Genitalia:    Deferred  Extremities:   Extremities normal, atraumatic, no cyanosis or edema  Pulses:   2+ and symmetric all extremities  Skin:   Skin color, texture, turgor normal, no rashes or lesions  Lymph nodes:   Cervical, supraclavicular, and axillary nodes normal  Neurologic:   CNII-XII intact, normal strength, sensation and reflexes    throughout    Assessment/Plan:   Brandy Valdez was seen today for annual exam.  Diagnoses and all orders for this visit:  Routine physical examination Today patient counseled on age appropriate routine health concerns for screening and prevention, each reviewed and up to date or declined. Immunizations reviewed and up to date or declined. Labs ordered and reviewed.  Risk factors for depression reviewed and negative. Hearing function and visual acuity are intact. ADLs screened and addressed as needed. Functional ability and level of safety reviewed and appropriate. Education, counseling and referrals performed based on assessed risks today. Patient provided with a copy of personalized plan for preventive services.  Essential hypertension Well controlled. Continue Norvasc 10 mg. F/u in 6 months. -     CBC with Differential/Platelet -     Comprehensive metabolic panel  Anxiety Doing well on Zoloft 50 mg, continue. F/u in 6 months.  Tobacco abuse Encouraged cessation, she is not ready to quit at this time.  Special screening for malignant neoplasms, colon Referral for colonoscopy.  Encounter for lipid screening for cardiovascular disease Updated lipid panel today. -     Lipid panel  Obesity, unspecified classification, unspecified obesity type, unspecified whether serious comorbidity present Continue  to work on diet and exercise. Discussed trying to get more active, work on portions.  Other orders -     sertraline (ZOLOFT) 50 MG tablet; Take 1 tablet (50 mg total) by mouth daily. -     amLODipine (NORVASC) 10 MG tablet; Take 1 tablet (10 mg total) by mouth daily.     Well Adult Exam: Labs ordered: Yes. Patient counseling was done. See below for items discussed. Discussed the patient's BMI.  The BMI BMI is not in the acceptable range; BMI management plan is completed Follow up in 6 months. Breast cancer screening: upcomgin. Cervical cancer screening: upcoming   Patient Counseling: [x]    Nutrition: Stressed importance of moderation in sodium/caffeine intake, saturated fat and cholesterol, caloric balance, sufficient intake of fresh fruits, vegetables, fiber, calcium, iron, and 1 mg of folate supplement per day (for females capable of pregnancy).  [x]    Stressed the importance of regular exercise.   [x]    Substance Abuse: Discussed cessation/primary  prevention of tobacco, alcohol, or other drug use; driving or other dangerous activities under the influence; availability of treatment for abuse.   [x]    Injury prevention: Discussed safety belts, safety helmets, smoke detector, smoking near bedding or upholstery.   []    Sexuality: Discussed sexually transmitted diseases, partner selection, use of condoms, avoidance of unintended pregnancy  and contraceptive alternatives.  [x]    Dental health: Discussed importance of regular tooth brushing, flossing, and dental visits.  [x]    Health maintenance and immunizations reviewed. Please refer to Health maintenance section.   CMA or LPN served as scribe during this visit. History, Physical, and Plan performed by medical provider. Documentation and orders reviewed and attested to.   Inda Coke, PA-C [place]

## 2018-05-16 NOTE — Patient Instructions (Signed)
It was great to see you!  Work on smoking cessation and increasing exercise :)  Follow-up in 6 months.  Health Maintenance, Female Adopting a healthy lifestyle and getting preventive care can go a long way to promote health and wellness. Talk with your health care provider about what schedule of regular examinations is right for you. This is a good chance for you to check in with your provider about disease prevention and staying healthy. In between checkups, there are plenty of things you can do on your own. Experts have done a lot of research about which lifestyle changes and preventive measures are most likely to keep you healthy. Ask your health care provider for more information. Weight and diet Eat a healthy diet  Be sure to include plenty of vegetables, fruits, low-fat dairy products, and lean protein.  Do not eat a lot of foods high in solid fats, added sugars, or salt.  Get regular exercise. This is one of the most important things you can do for your health. ? Most adults should exercise for at least 150 minutes each week. The exercise should increase your heart rate and make you sweat (moderate-intensity exercise). ? Most adults should also do strengthening exercises at least twice a week. This is in addition to the moderate-intensity exercise.  Maintain a healthy weight  Body mass index (BMI) is a measurement that can be used to identify possible weight problems. It estimates body fat based on height and weight. Your health care provider can help determine your BMI and help you achieve or maintain a healthy weight.  For females 30 years of age and older: ? A BMI below 18.5 is considered underweight. ? A BMI of 18.5 to 24.9 is normal. ? A BMI of 25 to 29.9 is considered overweight. ? A BMI of 30 and above is considered obese.  Watch levels of cholesterol and blood lipids  You should start having your blood tested for lipids and cholesterol at 52 years of age, then have this  test every 5 years.  You may need to have your cholesterol levels checked more often if: ? Your lipid or cholesterol levels are high. ? You are older than 52 years of age. ? You are at high risk for heart disease.  Cancer screening Lung Cancer  Lung cancer screening is recommended for adults 27-67 years old who are at high risk for lung cancer because of a history of smoking.  A yearly low-dose CT scan of the lungs is recommended for people who: ? Currently smoke. ? Have quit within the past 15 years. ? Have at least a 30-pack-year history of smoking. A pack year is smoking an average of one pack of cigarettes a day for 1 year.  Yearly screening should continue until it has been 15 years since you quit.  Yearly screening should stop if you develop a health problem that would prevent you from having lung cancer treatment.  Breast Cancer  Practice breast self-awareness. This means understanding how your breasts normally appear and feel.  It also means doing regular breast self-exams. Let your health care provider know about any changes, no matter how small.  If you are in your 20s or 30s, you should have a clinical breast exam (CBE) by a health care provider every 1-3 years as part of a regular health exam.  If you are 91 or older, have a CBE every year. Also consider having a breast X-ray (mammogram) every year.  If you have a  family history of breast cancer, talk to your health care provider about genetic screening.  If you are at high risk for breast cancer, talk to your health care provider about having an MRI and a mammogram every year.  Breast cancer gene (BRCA) assessment is recommended for women who have family members with BRCA-related cancers. BRCA-related cancers include: ? Breast. ? Ovarian. ? Tubal. ? Peritoneal cancers.  Results of the assessment will determine the need for genetic counseling and BRCA1 and BRCA2 testing.  Cervical Cancer Your health care  provider may recommend that you be screened regularly for cancer of the pelvic organs (ovaries, uterus, and vagina). This screening involves a pelvic examination, including checking for microscopic changes to the surface of your cervix (Pap test). You may be encouraged to have this screening done every 3 years, beginning at age 36.  For women ages 27-65, health care providers may recommend pelvic exams and Pap testing every 3 years, or they may recommend the Pap and pelvic exam, combined with testing for human papilloma virus (HPV), every 5 years. Some types of HPV increase your risk of cervical cancer. Testing for HPV may also be done on women of any age with unclear Pap test results.  Other health care providers may not recommend any screening for nonpregnant women who are considered low risk for pelvic cancer and who do not have symptoms. Ask your health care provider if a screening pelvic exam is right for you.  If you have had past treatment for cervical cancer or a condition that could lead to cancer, you need Pap tests and screening for cancer for at least 20 years after your treatment. If Pap tests have been discontinued, your risk factors (such as having a new sexual partner) need to be reassessed to determine if screening should resume. Some women have medical problems that increase the chance of getting cervical cancer. In these cases, your health care provider may recommend more frequent screening and Pap tests.  Colorectal Cancer  This type of cancer can be detected and often prevented.  Routine colorectal cancer screening usually begins at 52 years of age and continues through 52 years of age.  Your health care provider may recommend screening at an earlier age if you have risk factors for colon cancer.  Your health care provider may also recommend using home test kits to check for hidden blood in the stool.  A small camera at the end of a tube can be used to examine your colon  directly (sigmoidoscopy or colonoscopy). This is done to check for the earliest forms of colorectal cancer.  Routine screening usually begins at age 47.  Direct examination of the colon should be repeated every 5-10 years through 52 years of age. However, you may need to be screened more often if early forms of precancerous polyps or small growths are found.  Skin Cancer  Check your skin from head to toe regularly.  Tell your health care provider about any new moles or changes in moles, especially if there is a change in a mole's shape or color.  Also tell your health care provider if you have a mole that is larger than the size of a pencil eraser.  Always use sunscreen. Apply sunscreen liberally and repeatedly throughout the day.  Protect yourself by wearing long sleeves, pants, a wide-brimmed hat, and sunglasses whenever you are outside.  Heart disease, diabetes, and high blood pressure  High blood pressure causes heart disease and increases the risk of  stroke. High blood pressure is more likely to develop in: ? People who have blood pressure in the high end of the normal range (130-139/85-89 mm Hg). ? People who are overweight or obese. ? People who are African American.  If you are 34-76 years of age, have your blood pressure checked every 3-5 years. If you are 28 years of age or older, have your blood pressure checked every year. You should have your blood pressure measured twice-once when you are at a hospital or clinic, and once when you are not at a hospital or clinic. Record the average of the two measurements. To check your blood pressure when you are not at a hospital or clinic, you can use: ? An automated blood pressure machine at a pharmacy. ? A home blood pressure monitor.  If you are between 33 years and 77 years old, ask your health care provider if you should take aspirin to prevent strokes.  Have regular diabetes screenings. This involves taking a blood sample to  check your fasting blood sugar level. ? If you are at a normal weight and have a low risk for diabetes, have this test once every three years after 52 years of age. ? If you are overweight and have a high risk for diabetes, consider being tested at a younger age or more often. Preventing infection Hepatitis B  If you have a higher risk for hepatitis B, you should be screened for this virus. You are considered at high risk for hepatitis B if: ? You were born in a country where hepatitis B is common. Ask your health care provider which countries are considered high risk. ? Your parents were born in a high-risk country, and you have not been immunized against hepatitis B (hepatitis B vaccine). ? You have HIV or AIDS. ? You use needles to inject street drugs. ? You live with someone who has hepatitis B. ? You have had sex with someone who has hepatitis B. ? You get hemodialysis treatment. ? You take certain medicines for conditions, including cancer, organ transplantation, and autoimmune conditions.  Hepatitis C  Blood testing is recommended for: ? Everyone born from [birth year] through 1965. ? Anyone with known risk factors for hepatitis C.  Sexually transmitted infections (STIs)  You should be screened for sexually transmitted infections (STIs) including gonorrhea and chlamydia if: ? You are sexually active and are younger than 52 years of age. ? You are older than 52 years of age and your health care provider tells you that you are at risk for this type of infection. ? Your sexual activity has changed since you were last screened and you are at an increased risk for chlamydia or gonorrhea. Ask your health care provider if you are at risk.  If you do not have HIV, but are at risk, it may be recommended that you take a prescription medicine daily to prevent HIV infection. This is called pre-exposure prophylaxis (PrEP). You are considered at risk if: ? You are sexually active and do not regularly  use condoms or know the HIV status of your partner(s). ? You take drugs by injection. ? You are sexually active with a partner who has HIV.  Talk with your health care provider about whether you are at high risk of being infected with HIV. If you choose to begin PrEP, you should first be tested for HIV. You should then be tested every 3 months for as long as you are taking PrEP. Pregnancy  If  you are premenopausal and you may become pregnant, ask your health care provider about preconception counseling.  If you may become pregnant, take 400 to 800 micrograms (mcg) of folic acid every day.  If you want to prevent pregnancy, talk to your health care provider about birth control (contraception). Osteoporosis and menopause  Osteoporosis is a disease in which the bones lose minerals and strength with aging. This can result in serious bone fractures. Your risk for osteoporosis can be identified using a bone density scan.  If you are 83 years of age or older, or if you are at risk for osteoporosis and fractures, ask your health care provider if you should be screened.  Ask your health care provider whether you should take a calcium or vitamin D supplement to lower your risk for osteoporosis.  Menopause may have certain physical symptoms and risks.  Hormone replacement therapy may reduce some of these symptoms and risks. Talk to your health care provider about whether hormone replacement therapy is right for you. Follow these instructions at home:  Schedule regular health, dental, and eye exams.  Stay current with your immunizations.  Do not use any tobacco products including cigarettes, chewing tobacco, or electronic cigarettes.  If you are pregnant, do not drink alcohol.  If you are breastfeeding, limit how much and how often you drink alcohol.  Limit alcohol intake to no more than 1 drink per day for nonpregnant women. One drink equals 12 ounces of beer, 5 ounces of wine, or 1 ounces  of hard liquor.  Do not use street drugs.  Do not share needles.  Ask your health care provider for help if you need support or information about quitting drugs.  Tell your health care provider if you often feel depressed.  Tell your health care provider if you have ever been abused or do not feel safe at home. This information is not intended to replace advice given to you by your health care provider. Make sure you discuss any questions you have with your health care provider. Document Released: 04/20/2011 Document Revised: 03/12/2016 Document Reviewed: 07/09/2015 Elsevier Interactive Patient Education  [employer].

## 2018-05-17 ENCOUNTER — Other Ambulatory Visit: Payer: Self-pay | Admitting: Physician Assistant

## 2018-05-17 ENCOUNTER — Other Ambulatory Visit: Payer: Self-pay | Admitting: *Deleted

## 2018-05-17 DIAGNOSIS — E785 Hyperlipidemia, unspecified: Secondary | ICD-10-CM

## 2018-05-17 MED ORDER — ATORVASTATIN CALCIUM 10 MG PO TABS: 10.0000 mg | ORAL_TABLET | Freq: Every day | ORAL | 3 refills | Status: DC

## 2018-09-22 ENCOUNTER — Encounter: Payer: Self-pay | Admitting: Physician Assistant

## 2019-01-30 ENCOUNTER — Encounter: Payer: Self-pay | Admitting: Physician Assistant

## 2019-01-30 ENCOUNTER — Ambulatory Visit (INDEPENDENT_AMBULATORY_CARE_PROVIDER_SITE_OTHER): Payer: No Typology Code available for payment source | Admitting: Physician Assistant

## 2019-01-30 VITALS — BP 138/88

## 2019-01-30 DIAGNOSIS — F419 Anxiety disorder, unspecified: Secondary | ICD-10-CM

## 2019-01-30 DIAGNOSIS — I10 Essential (primary) hypertension: Secondary | ICD-10-CM | POA: Diagnosis not present

## 2019-01-30 MED ORDER — SERTRALINE HCL 50 MG PO TABS: 50.0000 mg | ORAL_TABLET | Freq: Every day | ORAL | 1 refills | Status: DC

## 2019-01-30 MED ORDER — AMLODIPINE BESYLATE 10 MG PO TABS: 10.0000 mg | ORAL_TABLET | Freq: Every day | ORAL | 1 refills | Status: DC

## 2019-01-30 NOTE — Progress Notes (Signed)
Virtual Visit via Video   I connected with Brandy Valdez on 01/30/19 at  9:00 AM EDT by a video enabled telemedicine application and verified that I am speaking with the correct person using two identifiers. Location patient: Home Location provider: [facility], Office Persons participating in the virtual visit: Bryndle Corredor, Inda Coke, PA, Inda Coke, [place].  I discussed the limitations of evaluation and management by telemedicine and the availability of in person appointments. The patient expressed understanding and agreed to proceed.  Subjective:   HPI:  Anxiety Pt for follow up, she is doing well considering the situation. Pt is currently working from home. Pt taking Zoloft 50 mg daily, tolerating well. Denies SI/HI.  Hypertension Currently taking Norvasc 10 mg. At home blood pressure readings are: 138/88. Patient denies chest pain, SOB, blurred vision, dizziness, unusual headaches, lower leg swelling. Patient is compliant with medication. Denies excessive caffeine intake, stimulant usage, excessive alcohol intake, or increase in salt consumption.  GAD 7 : Generalized Anxiety Score 01/30/2019 05/16/2018 12/27/2017 06/29/2017  Nervous, Anxious, on Edge 1 1 0 1  Control/stop worrying 0 0 0 1  Worry too much - different things 1 1 0 1  Trouble relaxing 1 0 1 1  Restless 0 1 0 1  Easily annoyed or irritable 0 0 0 1  Afraid - awful might happen 0 0 0 0  Total GAD 7 Score 3 3 1 6   Anxiety Difficulty Not difficult at all Not difficult at all - Not difficult at all     ROS: See pertinent positives and negatives per HPI.  Patient Active Problem List   Diagnosis Date Noted  . Tobacco abuse 05/16/2018  . Obesity 05/16/2018  . Hyperlipidemia 05/16/2018  . Chest pain with low risk for cardiac etiology 01/26/2017  . Essential hypertension 01/21/2017  . Anxiety 01/21/2017    Social History   Tobacco Use  . Smoking status: Current Some Day Smoker  . Smokeless tobacco:  Never Used  Substance Use Topics  . Alcohol use: Yes    Comment: Occcassionally glass of wine    Current Outpatient Medications:  .  amLODipine (NORVASC) 10 MG tablet, Take 1 tablet (10 mg total) by mouth daily., Disp: 90 tablet, Rfl: 1 .  atorvastatin (LIPITOR) 10 MG tablet, Take 1 tablet (10 mg total) by mouth daily., Disp: 90 tablet, Rfl: 3 .  ibuprofen (ADVIL,MOTRIN) 200 MG tablet, Take 600 mg by mouth 2 (two) times daily as needed for headache (pain)., Disp: , Rfl:  .  Melatonin 3 MG TABS, Take 3 mg by mouth at bedtime., Disp: , Rfl:  .  Multiple Vitamins-Minerals (EMERGEN-C VITAMIN C PO), Take 1 tablet by mouth See admin instructions. Take 1 tablet by mouth daily during cold and flu season to boost immune system, Disp: , Rfl:  .  oxymetazoline (AFRIN) 0.05 % nasal spray, Place 2 sprays into both nostrils 2 (two) times daily., Disp: , Rfl:  .  sertraline (ZOLOFT) 50 MG tablet, Take 1 tablet (50 mg total) by mouth daily., Disp: 90 tablet, Rfl: 1 .  influenza vac split quadrivalent PF (FLUZONE QUADRIVALENT) 0.5 ML injection, Fluzone Quad 2019-2020 (PF) 60 mcg (15 mcg x 4)/0.5 mL IM syringe  PHARMACIST ADMINISTERED IMMUNIZATION ADMINISTERED AT TIME OF DISPENSING, Disp: , Rfl:   Allergies  Allergen Reactions  . Penicillins Rash    Has patient had a PCN reaction causing immediate rash, facial/tongue/throat swelling, SOB or lightheadedness with hypotension: Yes Has patient had a PCN reaction causing  severe rash involving mucus membranes or skin necrosis: No Has patient had a PCN reaction that required hospitalization No Has patient had a PCN reaction occurring within the last 10 years: No If all of the above answers are "NO", then may proceed with Cephalosporin use.    Objective:   VITALS: Per patient if applicable, see vitals. GENERAL: Alert, appears well and in no acute distress. HEENT: Atraumatic, conjunctiva clear, no obvious abnormalities on inspection of external nose and ears.  NECK: Normal movements of the head and neck. CARDIOPULMONARY: No increased WOB. Speaking in clear sentences. I:E ratio WNL.  MS: Moves all visible extremities without noticeable abnormality. PSYCH: Pleasant and cooperative, well-groomed. Speech normal rate and rhythm. Affect is appropriate. Insight and judgement are appropriate. Attention is focused, linear, and appropriate.  NEURO: CN grossly intact. Oriented as arrived to appointment on time with no prompting. Moves both UE equally.  SKIN: No obvious lesions, wounds, erythema, or cyanosis noted on face or hands.  Assessment and Plan:   [name] was seen today for anxiety and hypertension.  Diagnoses and all orders for this visit:  Essential hypertension Controlled. Refill Norvasc 10 mg, follow-up in 6-12 months, sooner if issues.  Anxiety Controlled. Refill Zoloft 50 mg, follow-up in 6-12 months, sooner if issues.  Other orders -     sertraline (ZOLOFT) 50 MG tablet; Take 1 tablet (50 mg total) by mouth daily. -     amLODipine (NORVASC) 10 MG tablet; Take 1 tablet (10 mg total) by mouth daily.  . Reviewed expectations re: course of current medical issues. . Discussed self-management of symptoms. . Outlined signs and symptoms indicating need for more acute intervention. . Patient verbalized understanding and all questions were answered. Marland Kitchen Health Maintenance issues including appropriate healthy diet, exercise, and smoking avoidance were discussed with patient. . See orders for this visit as documented in the electronic medical record.  I discussed the assessment and treatment plan with the patient. The patient was provided an opportunity to ask questions and all were answered. The patient agreed with the plan and demonstrated an understanding of the instructions.   The patient was advised to call back or seek an in-person evaluation if the symptoms worsen or if the condition fails to improve as anticipated.  CMA or LPN served as  scribe during this visit. History, Physical, and Plan performed by medical provider. The above documentation has been reviewed and is accurate and complete.   [place], [place] 01/30/2019

## 2019-05-16 ENCOUNTER — Other Ambulatory Visit: Payer: Self-pay | Admitting: Physician Assistant

## 2019-08-24 ENCOUNTER — Other Ambulatory Visit: Payer: Self-pay | Admitting: Physician Assistant

## 2019-11-03 ENCOUNTER — Other Ambulatory Visit: Payer: Self-pay | Admitting: Podiatry

## 2019-11-03 ENCOUNTER — Other Ambulatory Visit: Payer: Self-pay

## 2019-11-03 ENCOUNTER — Ambulatory Visit (INDEPENDENT_AMBULATORY_CARE_PROVIDER_SITE_OTHER): Payer: 59

## 2019-11-03 ENCOUNTER — Ambulatory Visit (INDEPENDENT_AMBULATORY_CARE_PROVIDER_SITE_OTHER): Payer: 59 | Admitting: Podiatry

## 2019-11-03 VITALS — Temp 98.0°F

## 2019-11-03 DIAGNOSIS — M7671 Peroneal tendinitis, right leg: Secondary | ICD-10-CM | POA: Diagnosis not present

## 2019-11-03 DIAGNOSIS — M79672 Pain in left foot: Secondary | ICD-10-CM

## 2019-11-03 DIAGNOSIS — M7672 Peroneal tendinitis, left leg: Secondary | ICD-10-CM | POA: Diagnosis not present

## 2019-11-03 DIAGNOSIS — M79671 Pain in right foot: Secondary | ICD-10-CM | POA: Diagnosis not present

## 2019-11-03 MED ORDER — DICLOFENAC SODIUM 75 MG PO TBEC: 75.0000 mg | DELAYED_RELEASE_TABLET | Freq: Two times a day (BID) | ORAL | 2 refills | Status: DC

## 2019-11-03 NOTE — Progress Notes (Signed)
Subjective:   Patient ID: Brandy Valdez, female   DOB: 53 y.o.   MRN: [id number]   HPI Patient states that she is developed a lot of pain in the outside of her right foot and its been inflamed and sore and making it hard to walk.  States she has had plantar fasciitis in the past and has been doing well with that and states to been hurting for around 4 months and she does not remember injury   Review of Systems  All other systems reviewed and are negative.       Objective:  Physical Exam Vitals and nursing note reviewed.  Constitutional:      Appearance: She is well-developed.  Pulmonary:     Effort: Pulmonary effort is normal.  Musculoskeletal:        General: Normal range of motion.  Skin:    General: Skin is warm.  Neurological:     Mental Status: She is alert.     Neurovascular status intact muscle strength found to be adequate range of motion within normal limits.  Patient is found to have inflammation pain of the fifth metatarsal base with fluid buildup at the insertional point of the peroneal tendon into the base of the fifth metatarsal.  Patient is found to have good digital perfusion and is well oriented x3     Assessment:  Probability for peroneal tendinitis right with inflammation fluid at the insertion base fifth metatarsal     Plan:  H&P condition reviewed and I do recommend careful sheath injection explaining procedure to patient.  I did sterile prep and injected the sheath at its insertion 3 mg Dexasone Kenalog 5 mg Xylocaine and applied fascial brace to lift up the lateral side of the foot along with ice therapy supportive shoe and I did write prescription for diclofenac 75 mg twice daily.  Reappoint 2 weeks  X-rays indicate that there is some reactivity of tissue but no indications of fracture or other bone pathology

## 2019-11-17 ENCOUNTER — Encounter: Payer: Self-pay | Admitting: Podiatry

## 2019-11-17 ENCOUNTER — Ambulatory Visit (INDEPENDENT_AMBULATORY_CARE_PROVIDER_SITE_OTHER): Payer: 59 | Admitting: Podiatry

## 2019-11-17 ENCOUNTER — Other Ambulatory Visit: Payer: Self-pay

## 2019-11-17 VITALS — Temp 97.5°F

## 2019-11-17 DIAGNOSIS — M7671 Peroneal tendinitis, right leg: Secondary | ICD-10-CM | POA: Diagnosis not present

## 2019-11-17 NOTE — Progress Notes (Signed)
Subjective:   Patient ID: Brandy Valdez, female   DOB: 54 y.o.   MRN: [id number]   HPI Patient states feeling a lot better with diminishment of the discomfort and states that she is walking with a better heel toe gait now   ROS      Objective:  Physical Exam  Neurovascular status intact with patient's right peroneal tendon improved with pain still noted upon deep palpation but better than it was previously     Assessment:  Peroneal tendinitis improved right     Plan:  H&P reviewed condition recommended continued physical therapy anti-inflammatories and brace usage.  Advised what to do as far as gait processes going patient's discharge will be seen back as needed

## 2020-02-20 ENCOUNTER — Other Ambulatory Visit: Payer: Self-pay | Admitting: Physician Assistant

## 2020-03-22 ENCOUNTER — Other Ambulatory Visit: Payer: Self-pay

## 2020-03-22 ENCOUNTER — Ambulatory Visit (INDEPENDENT_AMBULATORY_CARE_PROVIDER_SITE_OTHER): Payer: 59 | Admitting: Physician Assistant

## 2020-03-22 ENCOUNTER — Other Ambulatory Visit: Payer: Self-pay | Admitting: Physician Assistant

## 2020-03-22 ENCOUNTER — Encounter: Payer: Self-pay | Admitting: Physician Assistant

## 2020-03-22 VITALS — BP 136/90 | HR 75 | Temp 98.4°F | Ht 66.5 in | Wt 201.5 lb

## 2020-03-22 DIAGNOSIS — E785 Hyperlipidemia, unspecified: Secondary | ICD-10-CM

## 2020-03-22 DIAGNOSIS — M25561 Pain in right knee: Secondary | ICD-10-CM | POA: Diagnosis not present

## 2020-03-22 DIAGNOSIS — I10 Essential (primary) hypertension: Secondary | ICD-10-CM | POA: Diagnosis not present

## 2020-03-22 DIAGNOSIS — F419 Anxiety disorder, unspecified: Secondary | ICD-10-CM

## 2020-03-22 LAB — COMPREHENSIVE METABOLIC PANEL
ALT: 24 U/L (ref 0–35)
AST: 16 U/L (ref 0–37)
Albumin: 4.6 g/dL (ref 3.5–5.2)
Alkaline Phosphatase: 82 U/L (ref 39–117)
BUN: 14 mg/dL (ref 6–23)
CO2: 28 mEq/L (ref 19–32)
Calcium: 9.3 mg/dL (ref 8.4–10.5)
Chloride: 101 mEq/L (ref 96–112)
Creatinine, Ser: 0.48 mg/dL (ref 0.40–1.20)
GFR: 134.87 mL/min (ref >60.00)
Glucose, Bld: 94 mg/dL (ref 70–99)
Potassium: 4.3 mEq/L (ref 3.5–5.1)
Sodium: 136 mEq/L (ref 135–145)
Total Bilirubin: 0.3 mg/dL (ref 0.2–1.2)
Total Protein: 6.7 g/dL (ref 6.0–8.3)

## 2020-03-22 LAB — CBC WITH DIFFERENTIAL/PLATELET
Basophils Absolute: 0.1 10*3/uL (ref 0.0–0.1)
Basophils Relative: 0.7 % (ref 0.0–3.0)
Eosinophils Absolute: 0.2 10*3/uL (ref 0.0–0.7)
Eosinophils Relative: 2.2 % (ref 0.0–5.0)
HCT: 41.7 % (ref 36.0–46.0)
Hemoglobin: 14.4 g/dL (ref 12.0–15.0)
Lymphocytes Relative: 31.6 % (ref 12.0–46.0)
Lymphs Abs: 3 10*3/uL (ref 0.7–4.0)
MCHC: 34.4 g/dL (ref 30.0–36.0)
MCV: 99.6 fl (ref 78.0–100.0)
Monocytes Absolute: 0.8 10*3/uL (ref 0.1–1.0)
Monocytes Relative: 8.6 % (ref 3.0–12.0)
Neutro Abs: 5.4 10*3/uL (ref 1.4–7.7)
Neutrophils Relative %: 56.9 % (ref 43.0–77.0)
Platelets: 318 10*3/uL (ref 150.0–400.0)
RBC: 4.19 Mil/uL (ref 3.87–5.11)
RDW: 12.7 % (ref 11.5–15.5)
WBC: 9.5 10*3/uL (ref 4.0–10.5)

## 2020-03-22 LAB — LIPID PANEL
Cholesterol: 244 mg/dL — ABNORMAL HIGH (ref 0–200)
HDL: 39.5 mg/dL (ref >39.00)
Total CHOL/HDL Ratio: 6
Triglycerides: 632 mg/dL — ABNORMAL HIGH (ref 0.0–149.0)

## 2020-03-22 LAB — LDL CHOLESTEROL, DIRECT: Direct LDL: 135 mg/dL

## 2020-03-22 MED ORDER — AMLODIPINE BESYLATE 10 MG PO TABS: 10.0000 mg | ORAL_TABLET | Freq: Every day | ORAL | 2 refills | Status: DC

## 2020-03-22 MED ORDER — SERTRALINE HCL 50 MG PO TABS: 50.0000 mg | ORAL_TABLET | Freq: Every day | ORAL | 2 refills | Status: DC

## 2020-03-22 MED ORDER — ATORVASTATIN CALCIUM 10 MG PO TABS: 10.0000 mg | ORAL_TABLET | Freq: Every day | ORAL | 1 refills | Status: DC

## 2020-03-22 NOTE — Patient Instructions (Addendum)
It was great to see you!  Refills sent!  I will be in touch with your lab results.  You will be contacted about your referral to the sports medicine doctor.  Take care,  Inda Coke PA-C

## 2020-03-22 NOTE — Progress Notes (Signed)
Brandy Valdez is a 54 y.o. female is here for follow up.  I acted as a Education administrator for [employer], PA-C [employer], LPN   History of Present Illness:   Chief Complaint  Patient presents with   Hypertension   Anxiety   Knee Pain   Hyperlipidemia    Needs Lipid panel, not fasting    HPI   Hypertension Pt currently taking Amlodipine 10 mg daily. Pt denies headaches, dizziness, blurred vision, chest pain, SOB or lower leg edema. Denies excessive caffeine intake, stimulant usage, excessive alcohol intake or increase in salt consumption.  BP Readings from Last 3 Encounters:  03/22/20 136/90  01/30/19 138/88  05/16/18 138/86    Anxiety Pt currently taking Zoloft 50 mg daily. Pt is feeling well on dosage. Denies SI/HI.  Knee pain Pt fell 3 weeks ago in flip flops slipped on water, hurt right elbow and knee. Right knee she is having crunching sounds and knife stabbing pains at times. Unable to bear weight on R knee (like when gardening). Pt said elbow is doing better. Also having left shoulder pain at times, hard time getting comfortable in the bed. Pt taking Diclofenac and Ibuprofen. Pt applied Tens unit to left shoulder with some relief.  Hyperlipidemia Pt was on Lipitor 10 mg daily, ran out of medication. Needs Lipid panel done, last done 2019. Was tolerating med well when taking.   Health Maintenance Due  Topic Date Due   COVID-19 Vaccine (1) Never done   HIV Screening  Never done   PAP SMEAR-Modifier  Never done   MAMMOGRAM  Never done   COLONOSCOPY  Never done    Past Medical History:  Diagnosis Date   Anxiety    Hypertension      Social History   Tobacco Use   Smoking status: Current Some Day Smoker   Smokeless tobacco: Never Used  Substance Use Topics   Alcohol use: Yes    Comment: Occcassionally glass of wine   Drug use: No    Past Surgical History:  Procedure Laterality Date   ESSURE TUBAL LIGATION Bilateral 2001    Family  History  Problem Relation Age of Onset   Hypertension Mother    Kidney cancer Mother    Stroke Father    Heart attack Father    Hypertension Father    Prostate cancer Father    Hypertension Sister    Diabetes Sister    Hypertension Maternal Grandmother    Breast cancer Maternal Grandmother    Hypertension Maternal Grandfather    Hypertension Paternal Grandmother    Hypertension Paternal Grandfather    Tuberculosis Paternal Grandfather    Hypertension Sister     PMHx, SurgHx, SocialHx, FamHx, Medications, and Allergies were reviewed in the Visit Navigator and updated as appropriate.   Patient Active Problem List   Diagnosis Date Noted   Tobacco abuse 05/16/2018   Obesity 05/16/2018   Hyperlipidemia 05/16/2018   Chest pain with low risk for cardiac etiology 01/26/2017   Essential hypertension 01/21/2017   Anxiety 01/21/2017    Social History   Tobacco Use   Smoking status: Current Some Day Smoker   Smokeless tobacco: Never Used  Substance Use Topics   Alcohol use: Yes    Comment: Occcassionally glass of wine   Drug use: No    Current Medications and Allergies:    Current Outpatient Medications:    amLODipine (NORVASC) 10 MG tablet, Take 1 tablet (10 mg total) by mouth daily., Disp: 90  tablet, Rfl: 2   atorvastatin (LIPITOR) 10 MG tablet, Take 1 tablet (10 mg total) by mouth daily., Disp: 90 tablet, Rfl: 3   BLACK COHOSH PO, Take 1 capsule by mouth daily., Disp: , Rfl:    diclofenac (VOLTAREN) 75 MG EC tablet, Take 1 tablet (75 mg total) by mouth 2 (two) times daily., Disp: 50 tablet, Rfl: 2   ibuprofen (ADVIL,MOTRIN) 200 MG tablet, Take 600 mg by mouth 2 (two) times daily as needed for headache (pain)., Disp: , Rfl:    Melatonin 3 MG TABS, Take 3 mg by mouth at bedtime., Disp: , Rfl:    Multiple Vitamins-Minerals (EMERGEN-C VITAMIN C PO), Take 1 tablet by mouth See admin instructions. Take 1 tablet by mouth daily during cold and flu  season to boost immune system, Disp: , Rfl:    oxymetazoline (AFRIN) 0.05 % nasal spray, Place 2 sprays into both nostrils 2 (two) times daily., Disp: , Rfl:    sertraline (ZOLOFT) 50 MG tablet, Take 1 tablet (50 mg total) by mouth daily., Disp: 90 tablet, Rfl: 2   Turmeric 500 MG CAPS, Take 1 capsule by mouth daily., Disp: , Rfl:    Allergies  Allergen Reactions   Penicillins Rash    Has patient had a PCN reaction causing immediate rash, facial/tongue/throat swelling, SOB or lightheadedness with hypotension: Yes Has patient had a PCN reaction causing severe rash involving mucus membranes or skin necrosis: No Has patient had a PCN reaction that required hospitalization No Has patient had a PCN reaction occurring within the last 10 years: No If all of the above answers are "NO", then may proceed with Cephalosporin use.    Review of Systems   ROS  Negative unless otherwise specified per HPI.  Vitals:   Vitals:   03/22/20 1116  BP: 136/90  Pulse: 75  Temp: 98.4 F (36.9 C)  TempSrc: Temporal  SpO2: 97%  Weight: 201 lb 8 oz (91.4 kg)  Height: 5' 6.5" (1.689 m)     Body mass index is 32.04 kg/m.   Physical Exam:    Physical Exam Vitals and nursing note reviewed.  Constitutional:      General: She is not in acute distress.    Appearance: She is well-developed. She is not ill-appearing or toxic-appearing.  Cardiovascular:     Rate and Rhythm: Normal rate and regular rhythm.     Pulses: Normal pulses.     Heart sounds: Normal heart sounds, S1 normal and S2 normal.     Comments: No LE edema Pulmonary:     Effort: Pulmonary effort is normal.     Breath sounds: Normal breath sounds.  Musculoskeletal:     Comments: RIGHT Knee exam: No deformity on inspection. Pain with palpation to lateral knee FROM   Skin:    General: Skin is warm and dry.  Neurological:     Mental Status: She is alert.     GCS: GCS eye subscore is 4. GCS verbal subscore is 5. GCS motor  subscore is 6.  Psychiatric:        Speech: Speech normal.        Behavior: Behavior normal. Behavior is cooperative.      Assessment and Plan:    Aysiah was seen today for hypertension, anxiety, knee pain and hyperlipidemia.  Diagnoses and all orders for this visit:  Essential hypertension Well controlled. Update CMP and refill Norvasc 10 mg daily, follow-up in 1 year, sooner if concerns. -     Comprehensive  metabolic panel -     CBC with Differential/Platelet  Anxiety Well controlled. Refill Zoloft 50 mg daily. Follow-up in 1 year, sooner if concerns. -     Comprehensive metabolic panel -     CBC with Differential/Platelet  Hyperlipidemia, unspecified hyperlipidemia type Update lipid panel and restart statin accordingly (will determine dosage based upon results.) -     Lipid panel  Acute pain of right knee Referral to sports medicine for further work-up of lateral knee pain.  Other orders -     amLODipine (NORVASC) 10 MG tablet; Take 1 tablet (10 mg total) by mouth daily. -     sertraline (ZOLOFT) 50 MG tablet; Take 1 tablet (50 mg total) by mouth daily.   Reviewed expectations re: course of current medical issues.  Discussed self-management of symptoms.  Outlined signs and symptoms indicating need for more acute intervention.  Patient verbalized understanding and all questions were answered.  See orders for this visit as documented in the electronic medical record.  Patient received an After Visit Summary.  CMA or LPN served as scribe during this visit. History, Physical, and Plan performed by medical provider. The above documentation has been reviewed and is accurate and complete.  Inda Coke, PA-C [place], [place] 03/22/2020  Follow-up: No follow-ups on file.

## 2020-04-02 ENCOUNTER — Ambulatory Visit: Payer: 59 | Admitting: Family Medicine

## 2020-04-05 ENCOUNTER — Ambulatory Visit (INDEPENDENT_AMBULATORY_CARE_PROVIDER_SITE_OTHER): Payer: 59

## 2020-04-05 ENCOUNTER — Other Ambulatory Visit: Payer: Self-pay

## 2020-04-05 ENCOUNTER — Ambulatory Visit (INDEPENDENT_AMBULATORY_CARE_PROVIDER_SITE_OTHER): Payer: 59 | Admitting: Family Medicine

## 2020-04-05 ENCOUNTER — Encounter: Payer: Self-pay | Admitting: Family Medicine

## 2020-04-05 ENCOUNTER — Ambulatory Visit: Payer: Self-pay

## 2020-04-05 VITALS — BP 142/90 | HR 80 | Ht 66.5 in | Wt 201.2 lb

## 2020-04-05 DIAGNOSIS — M7041 Prepatellar bursitis, right knee: Secondary | ICD-10-CM

## 2020-04-05 DIAGNOSIS — M67919 Unspecified disorder of synovium and tendon, unspecified shoulder: Secondary | ICD-10-CM

## 2020-04-05 DIAGNOSIS — M25512 Pain in left shoulder: Secondary | ICD-10-CM

## 2020-04-05 DIAGNOSIS — M25561 Pain in right knee: Secondary | ICD-10-CM

## 2020-04-05 DIAGNOSIS — M719 Bursopathy, unspecified: Secondary | ICD-10-CM | POA: Diagnosis not present

## 2020-04-05 NOTE — Patient Instructions (Signed)
Thank you for coming in today. Plan for PT for both conditions.  Use voltaren gel especially on the knee up to 4x daily.  Recheck with me in 4-6 weeks  Let me know if you are not doing well. We can reassess sooner.   Get xray today.

## 2020-04-05 NOTE — Progress Notes (Signed)
Subjective:    I'm seeing this patient as a consultation for:  Len Blalock, [place]. Note will be routed back to referring provider/PCP.  CC: R knee pain and L shoulder  I, Molly Weber, LAT, ATC, am serving as scribe for Dr. Lynne Leader.  HPI: Pt is a 54 y/o female presenting w/ c/o R knee pain x approximately month after suffering a fall in her garage when she landed on her R knee and R elbow.  She locates her pain to her R anterior knee.  She describes her pain as stabbing pain w/ aggravating activities.  Radiating pain: no  R knee swelling: no R knee mechanical symptoms: yes, crepitus Aggravating factors: kneeling; prolonged standing Treatments tried: diclofenac sodium  L shoulder: Locates pain to the L lateral shoulder that radiates into her L upper arm Aggravating factors: L shoulder ABd above 90; functional IR; laying on her L side Mechanical symptoms: No Treatments tried: dicolfenac sodium  Past medical history, Surgical history, Family history, Social history, Allergies, and medications have been entered into the medical record, reviewed.   Review of Systems: No new headache, visual changes, nausea, vomiting, diarrhea, constipation, dizziness, abdominal pain, skin rash, fevers, chills, night sweats, weight loss, swollen lymph nodes, body aches, joint swelling, muscle aches, chest pain, shortness of breath, mood changes, visual or auditory hallucinations.   Objective:    Vitals:   04/05/20 1247  BP: (!) 142/90  Pulse: 80  SpO2: 96%   General: Well Developed, well nourished, and in no acute distress.  Neuro/Psych: Alert and oriented x3, extra-ocular muscles intact, able to move all 4 extremities, sensation grossly intact. Skin: Warm and dry, no rashes noted.  Respiratory: Not using accessory muscles, speaking in full sentences, trachea midline.  Cardiovascular: Pulses palpable, no extremity edema. Abdomen: Does not appear distended. MSK:  Right knee normal-appearing  without significant swelling. Range of motion 0-120 degrees with crepitation. Mildly tender palpation anterior knee overlying anterior proximal tibia. Stable ligamentous exam. Intact strength. Negative McMurray's test.  Left shoulder normal-appearing Range of motion abduction full however painful arc.  External rotation full.  Internal rotation lumbar spine. Strength 4/5 abduction 4/5 external rotation 5/5 internal rotation. Positive Hawkins and Neer's test.  Negative Yergason's and speeds test.  Positive empty can test.  Pulses capillary fill and sensation are intact distally.  Lab and Radiology Results  Diagnostic Limited MSK Ultrasound of: Right knee Quad tendon intact normal-appearing No significant joint effusion present in superior patellar space. Patellar tendon intact.  Mild hypoechoic fluid tracking superficial to patellar tendon. Medial lateral joint line slightly narrowed without significant degeneration of meniscus visible. Posterior knee no Baker's cyst Impression: Patellar bursitis  Diagnostic Limited MSK Ultrasound of: Left shoulder Biceps tendon intact in bicipital groove normal-appearing Subscapularis tendon intact normal. Supraspinatus tendon intact without visible tear.  Slight increase thickness of subacromial bursa present. Infraspinatus tendon intact normal-appearing AC joint narrowed with effusion. 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  No visible rotator cuff tear.  X-ray images left shoulder and right knee obtained today personally and independently reviewed  Right knee: Mild medial compartment DJD.  No acute fractures.  Left shoulder: No acute fractures.  Minimal degenerative changes.  Await formal radiology review  Impression and Recommendations:    Assessment and Plan: 54 y.o. female with   Right anterior knee pain after fall.  Main finding is mild prepatellar bursitis.  Patient also has new crepitations indicating  chondromalacia patella.  Plan for physical therapy and  Voltaren gel and recheck in 4 to 6 weeks.  Left shoulder pain after fall.  Fortunately no large full-thickness rotator cuff tears visible on ultrasound.  Again plan for physical therapy.  Plan to recheck in 4 to 6 weeks.   Orders Placed This Encounter  Procedures  . [place] LIMITED JOINT SPACE STRUCTURES LOW RIGHT(NO LINKED CHARGES)    Order Specific Question:   Reason for Exam (SYMPTOM  OR DIAGNOSIS REQUIRED)    Answer:   R knee pain    Order Specific Question:   Preferred imaging location?    Answer:   [facility]  . DG Knee AP/LAT W/Sunrise Right    Standing Status:   Future    Standing Expiration Date:   04/05/2021    Order Specific Question:   Reason for Exam (SYMPTOM  OR DIAGNOSIS REQUIRED)    Answer:   eval right knee pain 1 month after fall    Order Specific Question:   Is patient pregnant?    Answer:   No    Order Specific Question:   Preferred imaging location?    Answer:   [facility]    Order Specific Question:   Radiology Contrast Protocol - do NOT remove file path    Answer:   \\[email or web address].[email or web address]  . DG Shoulder Left    Standing Status:   Future    Standing Expiration Date:   04/05/2021    Order Specific Question:   Reason for Exam (SYMPTOM  OR DIAGNOSIS REQUIRED)    Answer:   eval left shoulder pain 1 month after fall    Order Specific Question:   Is patient pregnant?    Answer:   No    Order Specific Question:   Preferred imaging location?    Answer:   [facility]    Order Specific Question:   Radiology Contrast Protocol - do NOT remove file path    Answer:   \\[email or web address]\[email or web address]\DXFluoroContrastProtocols.pdf  . Ambulatory referral to Physical Therapy    Referral Priority:   Routine    Referral Type:   Physical Medicine    Referral Reason:   Specialty Services Required    Requested Specialty:   Physical Therapy   No orders of  the defined types were placed in this encounter.   Discussed warning signs or symptoms. Please see discharge instructions. Patient expresses understanding.   The above documentation has been reviewed and is accurate and complete Lynne Leader, M.D.

## 2020-04-08 NOTE — Progress Notes (Signed)
X-ray right knee looks pretty normal to radiology.

## 2020-04-08 NOTE — Progress Notes (Signed)
X-ray left shoulder looks normal per radiology

## 2020-04-10 ENCOUNTER — Telehealth: Payer: Self-pay | Admitting: Physician Assistant

## 2020-04-10 NOTE — Telephone Encounter (Signed)
LVM for patient to call back and schedule PT with Ander Purpura

## 2020-04-25 ENCOUNTER — Encounter: Payer: Self-pay | Admitting: Physical Therapy

## 2020-04-25 ENCOUNTER — Other Ambulatory Visit: Payer: Self-pay

## 2020-04-25 ENCOUNTER — Ambulatory Visit (INDEPENDENT_AMBULATORY_CARE_PROVIDER_SITE_OTHER): Payer: 59 | Admitting: Physical Therapy

## 2020-04-25 DIAGNOSIS — M25561 Pain in right knee: Secondary | ICD-10-CM

## 2020-04-25 DIAGNOSIS — M25512 Pain in left shoulder: Secondary | ICD-10-CM

## 2020-04-25 NOTE — Patient Instructions (Signed)
Access Code: [email or web address] URL: [email or web address] Date: 04/25/2020 Prepared by: Lyndee Hensen  Exercises Supine Shoulder Flexion with Dowel - 1-2 x daily - 1 sets - 10 reps Sidelying Shoulder External Rotation - 1-2 x daily - 1 sets - 10 reps Standing Row with Anchored Resistance - 2 x daily - 2 sets - 10 reps Seated Knee Extension AROM - 2 x daily - 2 sets - 10 reps

## 2020-04-28 ENCOUNTER — Encounter: Payer: Self-pay | Admitting: Physical Therapy

## 2020-04-28 NOTE — Therapy (Addendum)
[facility] [address] [place], [place], [address] Phone: [phone]   Fax:  [phone]  Physical Therapy Evaluation  Patient Details  Name: Brandy Valdez MRN: [id number] Date of Birth: [birth year] Referring Provider (PT): Lynne Leader   Encounter Date: 04/25/2020   PT End of Session - 04/28/20 2116    Visit Number 1    Number of Visits 12    Date for PT Re-Evaluation 06/06/20    Authorization Type UHC    PT Start Time 1430    PT Stop Time 1512    PT Time Calculation (min) 42 min    Activity Tolerance Patient tolerated treatment well    Behavior During Therapy [facility] for tasks assessed/performed           Past Medical History:  Diagnosis Date  . Anxiety   . Hypertension     Past Surgical History:  Procedure Laterality Date  . ESSURE TUBAL LIGATION Bilateral 2001    There were no vitals filed for this visit.    Subjective Assessment - 04/28/20 2111    Subjective Pt had fall in garage, 1 mo ago.  Pain now in R knee and L shoulder. Is R handed.  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    Limitations Lifting;Standing;Walking;House hold activities    Patient Stated Goals decreased pain    Currently in Pain? Yes    Pain Score 7     Pain Location Shoulder    Pain Orientation Left    Pain Descriptors / Indicators Aching    Pain Type Acute pain    Pain Onset 1 to 4 weeks ago    Pain Frequency Intermittent    Pain Score 2    Pain Location Knee    Pain Orientation Right    Pain Descriptors / Indicators Aching    Pain Type Acute pain    Pain Onset 1 to 4 weeks ago    Pain Frequency Intermittent              [facility] PT Assessment - 04/28/20 0001      Assessment   Medical Diagnosis L shoulder pain, R knee pain    Referring Provider (PT) Lynne Leader    Hand Dominance Right    Prior Therapy no      Balance Screen   Has the patient fallen in the past 6 months Yes     How many times? 1    Has the patient had a decrease in activity level because of a fear of falling?  No    Is the patient reluctant to leave their home because of a fear of falling?  No      Prior Function   Level of Independence Independent      Cognition   Overall Cognitive Status Within Functional Limits for tasks assessed      ROM / Strength   AROM / PROM / Strength PROM      AROM   Left Shoulder Flexion 150 Degrees    Left Shoulder ABduction 145 Degrees    Left Shoulder Internal Rotation --   wnl   Left Shoulder External Rotation --   wnl     PROM   PROM Assessment Site Shoulder    Right/Left Shoulder Left    Left Shoulder Flexion 155 Degrees    Left Shoulder ABduction 150 Degrees    Left Shoulder Internal Rotation --   wnl  Left Shoulder External Rotation --   wnl     Strength   Left Shoulder Flexion 4/5    Left Shoulder ABduction 4-/5    Left Shoulder Internal Rotation 4+/5    Left Shoulder External Rotation 4+/5      Palpation   Palpation comment Pain in L supraspinatus, Radiating pain into deltoid with elevation, R Knee: mild pain at medial and lateral joint line.       Special Tests   Other special tests Painful arc, painful empty can,                       Objective measurements completed on examination: See above findings.       [facility] Adult PT Treatment/Exercise - 04/28/20 0001      Exercises   Exercises Shoulder;Knee/Hip      Knee/Hip Exercises: Seated   Long Arc Quad 15 reps;Right      Shoulder Exercises: Supine   Flexion 20 reps    Flexion Limitations cane/ AAROM      Shoulder Exercises: Sidelying   External Rotation 20 reps;AROM      Shoulder Exercises: Standing   Row 20 reps    Theraband Level (Shoulder Row) Level 2 (Red)                  PT Education - 04/28/20 2115    Education Details PT POC, Exam findings, HEP    Person(s) Educated Patient    Methods Explanation;Demonstration;Verbal cues;Handout;Tactile  cues    Comprehension Verbalized understanding;Returned demonstration;Verbal cues required;Tactile cues required;Need further instruction            PT Short Term Goals - 04/28/20 2122      PT SHORT TERM GOAL #1   Title Pt to be independent with initial HEP    Time 2    Period Weeks    Status New    Target Date 05/09/20      PT SHORT TERM GOAL #2   Title Pt to demo decreased pain in L shoulder, to 0-4/10 with activity    Time 2    Period Weeks    Status New    Target Date 05/09/20             PT Long Term Goals - 04/28/20 2124      PT LONG TERM GOAL #1   Title Pt to be independent with final HEP    Time 6    Period Weeks    Status New    Target Date 06/06/20      PT LONG TERM GOAL #2   Title Pt to report decreased pain in L shoulder to 0-2/10 with activity and IADLS.    Time 6    Period Weeks    Status New    Target Date 06/06/20      PT LONG TERM GOAL #3   Title Pt to report decreased pain in R knee to 0-1/10 with standing activity    Time 6    Period Weeks    Status New    Target Date 06/06/20      PT LONG TERM GOAL #4   Title Pt to demo full AROM of L shoulder to be WNL and pain free , to improve ability for elevation and IADLS.    Time 6    Period Weeks    Status New    Target Date 06/06/20      PT LONG TERM GOAL #5  Title Pt to demo improved strength in L shoulder to at least 4+/5 to improve ability for reaching, lifting, carrying and IADLs.    Time 6    Period Weeks    Status New    Target Date 06/06/20                  Plan - 04/28/20 2133    Clinical Impression Statement Pt presents with primary complaint of increased pain in L shoulder and R knee. R knee has subsided some, but L shoulder continues to be very painful. Pt with decreased ROM and strength in shoulder, with decreased ability for elevation, reaching, lifting, carrying, and work duties. Pt with decreased tolerance for standing, walking, stairs and functinoal activities  due to R knee pain. Pt to benefit from skilled PT to improve deficits and pain.    Examination-Activity Limitations Bathing;Locomotion Level;Reach Overhead;Carry;Lift;Stairs;Squat    Examination-Participation Restrictions Yard Work;Cleaning;Community Activity;Laundry;Shop    Stability/Clinical Decision Making Stable/Uncomplicated    Clinical Decision Making Low    Rehab Potential Good    PT Frequency 2x / week    PT Duration 6 weeks    PT Treatment/Interventions ADLs/Self Care Home Management;Electrical Stimulation;Cryotherapy;Ultrasound;Traction;Moist Heat;Iontophoresis 31m/ml Dexamethasone;Gait training;Stair training;Functional mobility training;Therapeutic activities;Therapeutic exercise;Balance training;Patient/family education;Neuromuscular re-education;Manual techniques;Vasopneumatic Device;Taping;Dry needling;Passive range of motion;Joint Manipulations;Spinal Manipulations    PT Home Exercise Plan [email or web address]   Consulted and Agree with Plan of Care Patient           Patient will benefit from skilled therapeutic intervention in order to improve the following deficits and impairments:  Pain, Decreased mobility, Decreased strength, Decreased range of motion, Decreased activity tolerance, Difficulty walking  Visit Diagnosis: Acute pain of left shoulder  Acute pain of right knee     Problem List Patient Active Problem List   Diagnosis Date Noted  . Tobacco abuse 05/16/2018  . Obesity 05/16/2018  . Hyperlipidemia 05/16/2018  . Chest pain with low risk for cardiac etiology 01/26/2017  . Essential hypertension 01/21/2017  . Anxiety 01/21/2017   LLyndee Hensen PT, DPT 9:51 PM  04/28/20    [place] [address]Phone: [phone]  Fax:  [phone] Name: SKalen NeidertMRN: [id number] of Birth: 8November 08, 1967  PHYSICAL THERAPY DISCHARGE SUMMARY  Visits from Start of Care: 1 Plan: Patient  agrees to discharge.  Patient goals were not met. Patient is being discharged due to not returning since the last visit.  ?????     LLyndee Hensen PT, DPT 3:34 PM  03/06/21

## 2020-05-10 ENCOUNTER — Ambulatory Visit: Payer: 59 | Admitting: Family Medicine

## 2020-05-14 ENCOUNTER — Encounter: Payer: 59 | Admitting: Physical Therapy

## 2020-08-13 IMAGING — DX DG KNEE AP/LAT W/ SUNRISE*R*
3 series · 3 of 3 positions shown · non-contrast
Comparison: None.

CLINICAL DATA: Status post fall 1 month ago with subsequent right
knee pain.

EXAM:
RIGHT KNEE 3 VIEWS

[knee ap]
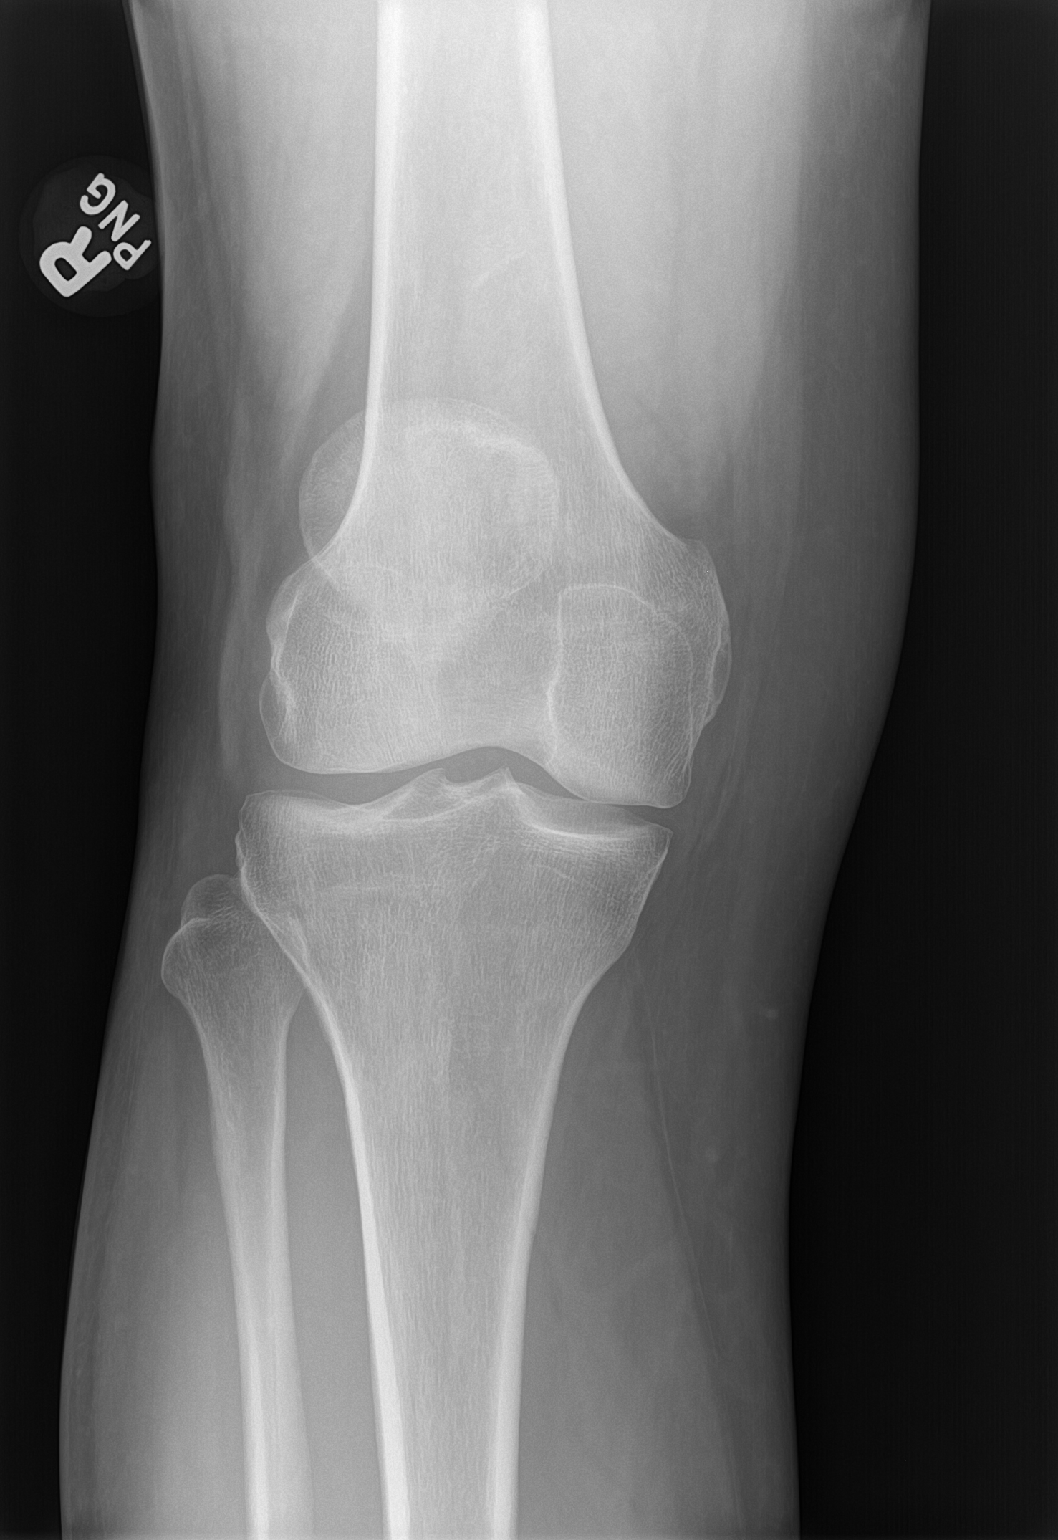

[knee lat]
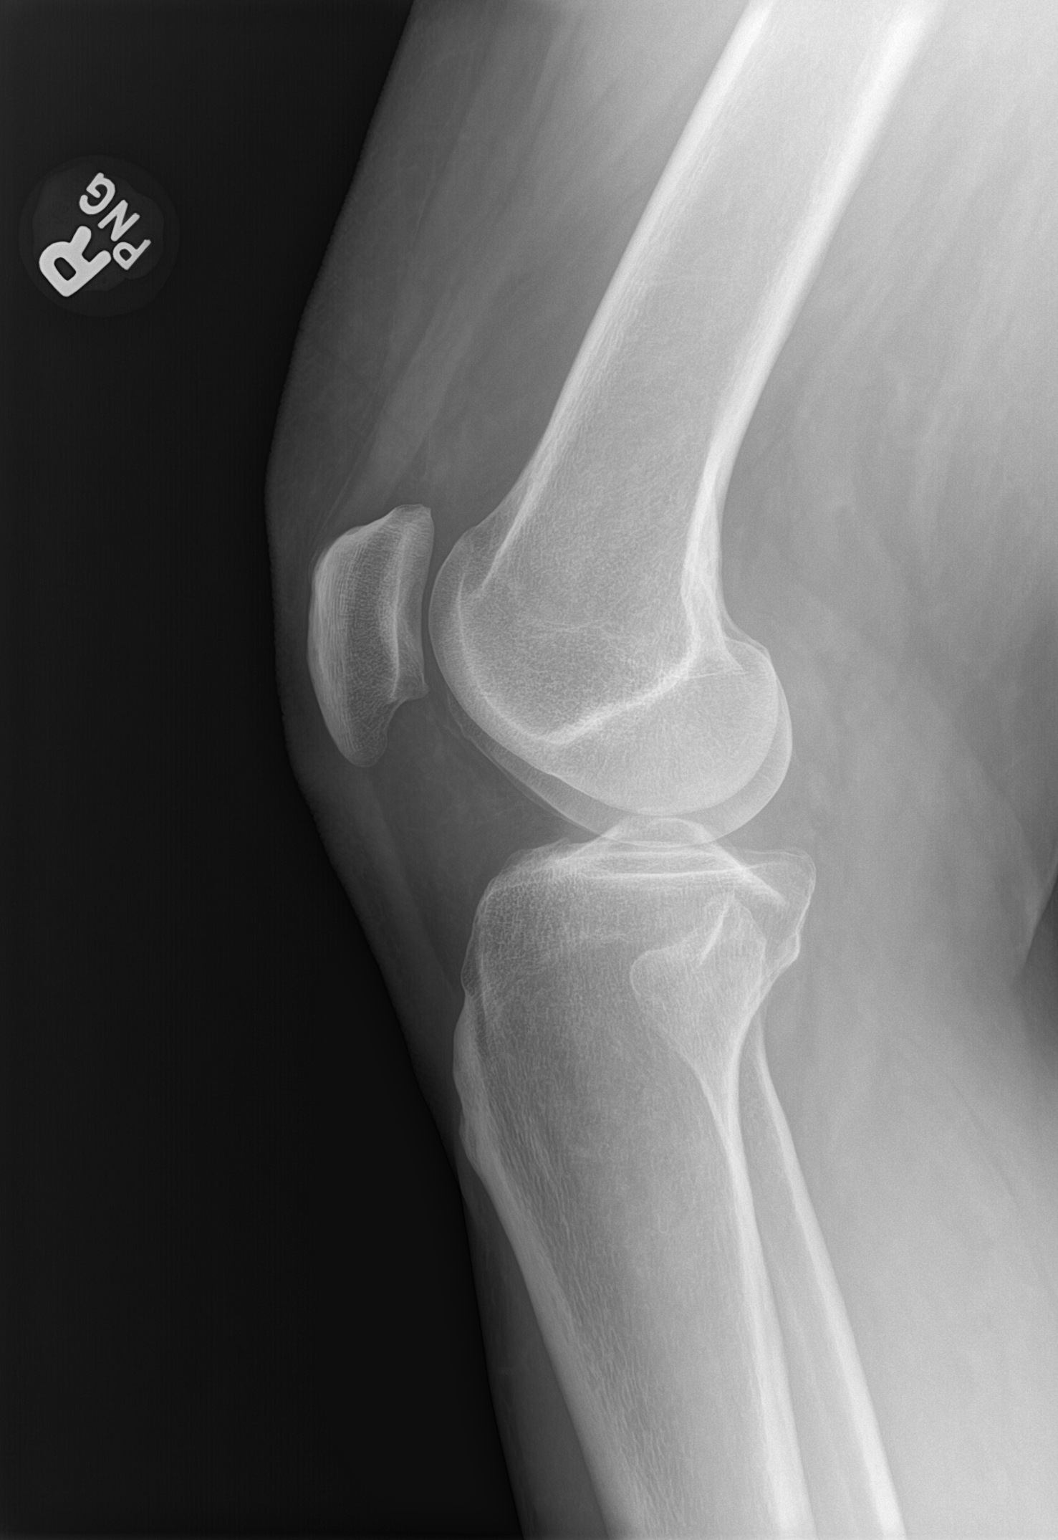

[patella]
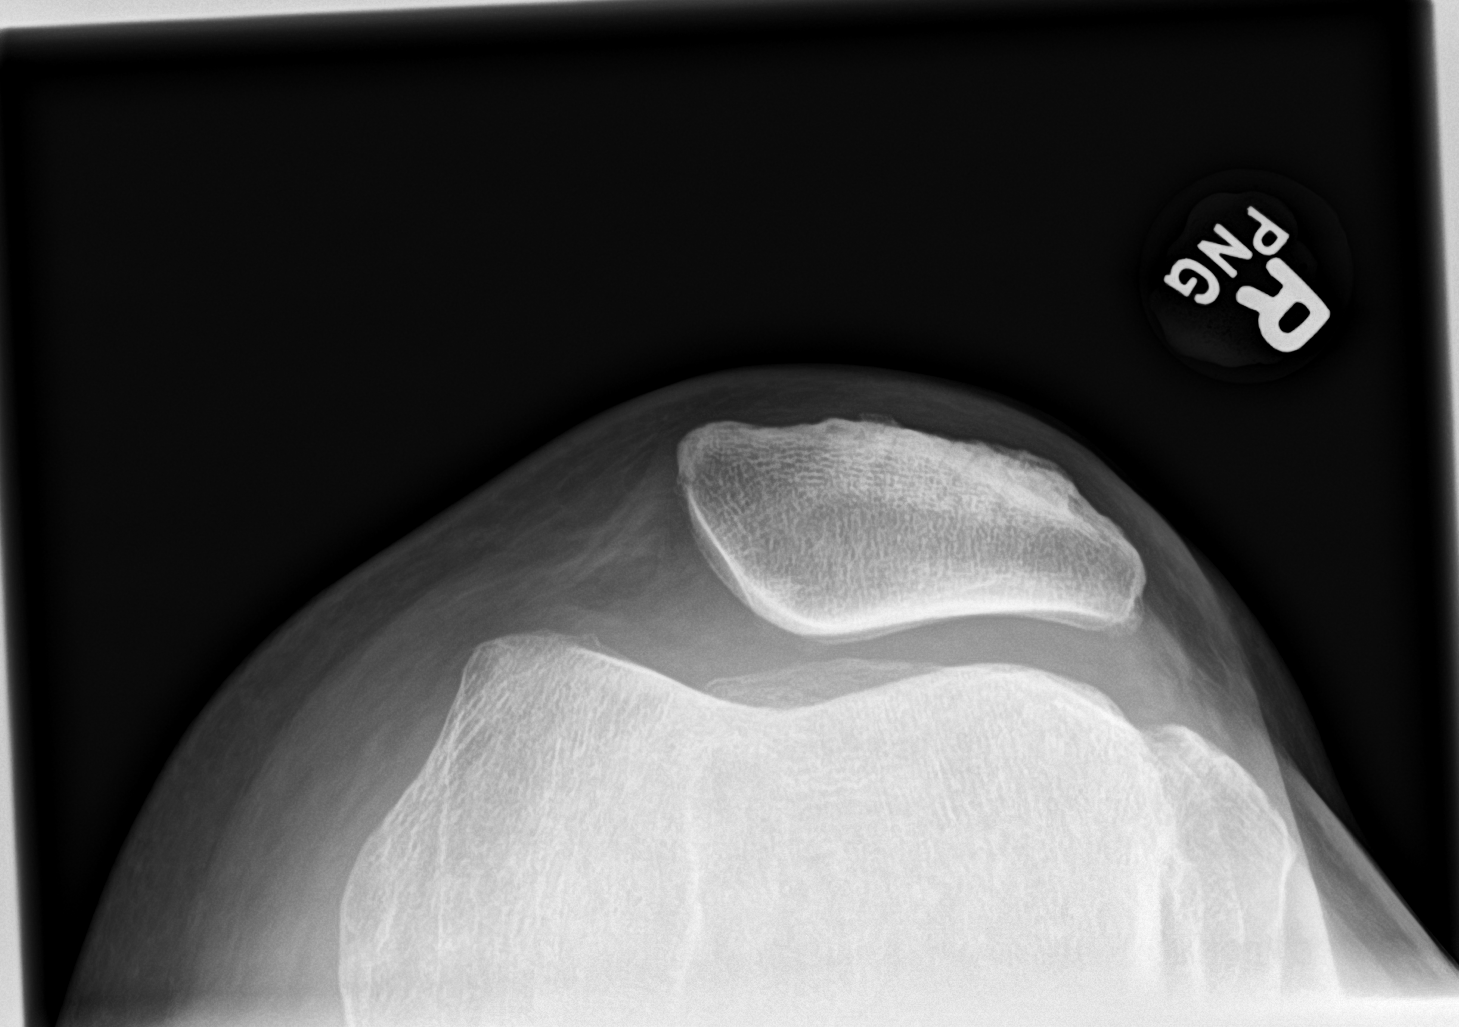

[3 of 3 positions shown; findings below may reference images not displayed]

FINDINGS: No evidence of fracture, dislocation, or joint effusion. No evidence
of arthropathy or other focal bone abnormality. Soft tissues are
unremarkable.
IMPRESSION: Negative.

## 2020-09-13 ENCOUNTER — Other Ambulatory Visit: Payer: Self-pay | Admitting: Physician Assistant

## 2020-12-11 ENCOUNTER — Other Ambulatory Visit: Payer: Self-pay | Admitting: Physician Assistant

## 2021-01-10 ENCOUNTER — Other Ambulatory Visit: Payer: Self-pay | Admitting: Physician Assistant

## 2021-02-10 ENCOUNTER — Other Ambulatory Visit: Payer: Self-pay | Admitting: Physician Assistant

## 2021-02-15 ENCOUNTER — Other Ambulatory Visit: Payer: Self-pay | Admitting: Physician Assistant

## 2021-03-04 ENCOUNTER — Other Ambulatory Visit: Payer: Self-pay

## 2021-03-04 ENCOUNTER — Ambulatory Visit (INDEPENDENT_AMBULATORY_CARE_PROVIDER_SITE_OTHER): Payer: 59 | Admitting: Family Medicine

## 2021-03-04 ENCOUNTER — Encounter: Payer: Self-pay | Admitting: Family Medicine

## 2021-03-04 VITALS — BP 132/84 | HR 76 | Temp 98.6°F | Ht 66.5 in | Wt 199.8 lb

## 2021-03-04 DIAGNOSIS — E785 Hyperlipidemia, unspecified: Secondary | ICD-10-CM

## 2021-03-04 DIAGNOSIS — I10 Essential (primary) hypertension: Secondary | ICD-10-CM

## 2021-03-04 DIAGNOSIS — F419 Anxiety disorder, unspecified: Secondary | ICD-10-CM | POA: Diagnosis not present

## 2021-03-04 LAB — LDL CHOLESTEROL, DIRECT: Direct LDL: 104 mg/dL

## 2021-03-04 LAB — COMPREHENSIVE METABOLIC PANEL
ALT: 27 U/L (ref 0–35)
AST: 20 U/L (ref 0–37)
Albumin: 4.5 g/dL (ref 3.5–5.2)
Alkaline Phosphatase: 78 U/L (ref 39–117)
BUN: 14 mg/dL (ref 6–23)
CO2: 26 mEq/L (ref 19–32)
Calcium: 9.2 mg/dL (ref 8.4–10.5)
Chloride: 102 mEq/L (ref 96–112)
Creatinine, Ser: 0.46 mg/dL (ref 0.40–1.20)
GFR: 108.22 mL/min (ref >60.00)
Glucose, Bld: 99 mg/dL (ref 70–99)
Potassium: 4 mEq/L (ref 3.5–5.1)
Sodium: 136 mEq/L (ref 135–145)
Total Bilirubin: 0.3 mg/dL (ref 0.2–1.2)
Total Protein: 7 g/dL (ref 6.0–8.3)

## 2021-03-04 LAB — LIPID PANEL
Cholesterol: 175 mg/dL (ref 0–200)
HDL: 43.8 mg/dL (ref >39.00)
NonHDL: 131.33
Total CHOL/HDL Ratio: 4
Triglycerides: 322 mg/dL — ABNORMAL HIGH (ref 0.0–149.0)
VLDL: 64.4 mg/dL — ABNORMAL HIGH (ref 0.0–40.0)

## 2021-03-04 LAB — CBC
HCT: 41.7 % (ref 36.0–46.0)
Hemoglobin: 14.3 g/dL (ref 12.0–15.0)
MCHC: 34.4 g/dL (ref 30.0–36.0)
MCV: 98 fl (ref 78.0–100.0)
Platelets: 291 10*3/uL (ref 150.0–400.0)
RBC: 4.25 Mil/uL (ref 3.87–5.11)
RDW: 12.6 % (ref 11.5–15.5)
WBC: 9.7 10*3/uL (ref 4.0–10.5)

## 2021-03-04 LAB — TSH: TSH: 0.91 u[IU]/mL (ref 0.35–4.50)

## 2021-03-04 MED ORDER — AMLODIPINE BESYLATE 10 MG PO TABS: 10.0000 mg | ORAL_TABLET | Freq: Every day | ORAL | 3 refills | Status: DC

## 2021-03-04 MED ORDER — ATORVASTATIN CALCIUM 10 MG PO TABS: 1.0000 | ORAL_TABLET | Freq: Every day | ORAL | 3 refills | Status: DC

## 2021-03-04 MED ORDER — SERTRALINE HCL 50 MG PO TABS: 50.0000 mg | ORAL_TABLET | Freq: Every day | ORAL | 3 refills | Status: DC

## 2021-03-04 NOTE — Assessment & Plan Note (Signed)
Continue Zoloft 50 mg daily.  She is doing well this.

## 2021-03-04 NOTE — Assessment & Plan Note (Signed)
At goal on amlodipine 10 mg daily.  We will continue this.  Discussed home monitoring.  We will check labs today.

## 2021-03-04 NOTE — Patient Instructions (Signed)
It was very nice to see you today!  We will refill your medications today.  We will check blood work today.  Please come back in a year for your next checkup.  Come back to see [place] sooner if needed.  Take care, Dr Jerline Pain  PLEASE NOTE:  If you had any lab tests please let us know if you have not heard back within a few days. You may see your results on mychart before we have a chance to review them but we will give you a call once they are reviewed by [place]. If we ordered any referrals today, please let us know if you have not heard from their office within the next week.   Please try these tips to maintain a healthy lifestyle:   Eat at least 3 REAL meals and 1-2 snacks per day.  Aim for no more than 5 hours between eating.  If you eat breakfast, please do so within one hour of getting up.    Each meal should contain half fruits/vegetables, one quarter protein, and one quarter carbs (no bigger than a computer mouse)   Cut down on sweet beverages. This includes juice, soda, and sweet tea.     Drink at least 1 glass of water with each meal and aim for at least 8 glasses per day   Exercise at least 150 minutes every week.

## 2021-03-04 NOTE — Assessment & Plan Note (Signed)
Continue Lipitor 10 mg daily.  We will check lipid panel and other labs today.  She is tolerating med well without side effects.

## 2021-03-04 NOTE — Progress Notes (Signed)
   Brandy Valdez is a 55 y.o. female who presents today for an office visit.  Assessment/Plan:  Chronic Problems Addressed Today: Hyperlipidemia Continue Lipitor 10 mg daily.  We will check lipid panel and other labs today.  She is tolerating med well without side effects.  Anxiety Continue Zoloft 50 mg daily.  She is doing well this.  Essential hypertension At goal on amlodipine 10 mg daily.  We will continue this.  Discussed home monitoring.  We will check labs today.    Subjective:  HPI:  See A/P.       Objective:  Physical Exam: BP 132/84   Pulse 76   Temp 98.6 F (37 C) (Temporal)   Ht 5' 6.5" (1.689 m)   Wt 199 lb 12.8 oz (90.6 kg)   LMP 04/11/2018   SpO2 96%   BMI 31.77 kg/m   Gen: No acute distress, resting comfortably CV: Regular rate and rhythm with no murmurs appreciated Pulm: Normal work of breathing, clear to auscultation bilaterally with no crackles, wheezes, or rhonchi Neuro: Grossly normal, moves all extremities Psych: Normal affect and thought content      Dawnell Bryant M. Jerline Pain, MD 03/04/2021 11:38 AM

## 2021-03-06 NOTE — Progress Notes (Signed)
Please inform patient of the following:  Labs are all stable.  Do not need to make any changes to treatment plan at this time.  Would like for them to continue working on diet and exercise and we can recheck in a year.

## 2021-03-11 ENCOUNTER — Other Ambulatory Visit: Payer: Self-pay | Admitting: Physician Assistant

## 2021-03-13 ENCOUNTER — Other Ambulatory Visit: Payer: Self-pay | Admitting: Physician Assistant

## 2022-01-31 DIAGNOSIS — S61022A Laceration with foreign body of left thumb without damage to nail, initial encounter: Secondary | ICD-10-CM | POA: Diagnosis not present

## 2022-03-11 ENCOUNTER — Telehealth: Payer: Self-pay | Admitting: Family Medicine

## 2022-03-12 MED ORDER — SERTRALINE HCL 50 MG PO TABS: 50.0000 mg | ORAL_TABLET | Freq: Every day | ORAL | 0 refills | Status: DC

## 2022-03-12 MED ORDER — AMLODIPINE BESYLATE 10 MG PO TABS: 10.0000 mg | ORAL_TABLET | Freq: Every day | ORAL | 0 refills | Status: DC

## 2022-03-12 MED ORDER — ATORVASTATIN CALCIUM 10 MG PO TABS: 10.0000 mg | ORAL_TABLET | Freq: Every day | ORAL | 0 refills | Status: DC

## 2022-03-12 NOTE — Telephone Encounter (Signed)
Left detailed message on personal voicemail Rx's were sent to pharmacy. Please keep upcoming appt. Any questions please call office.

## 2022-03-12 NOTE — Addendum Note (Signed)
Addended by: Marian Sorrow on: 03/12/2022 11:00 AM   Modules accepted: Orders

## 2022-03-12 NOTE — Telephone Encounter (Signed)
Pt is requesting refills to get her to the scheduled appointment with Inda Coke, PCP.  ..  LAST APPOINTMENT DATE:   03/04/2021 OV  NEXT APPOINTMENT DATE: 03/19/22 OV  MEDICATION:  amLODipine (NORVASC) 10 MG tablet  atorvastatin (LIPITOR) 10 MG tablet  sertraline (ZOLOFT) 50 MG tablet   Is the patient out of medication?  "Will run out in a few days"   PHARMACY: [facility] [id number] - [place], [address] [place] [address]  Phone:  [phone]  Fax:  [phone]

## 2022-03-19 ENCOUNTER — Ambulatory Visit (INDEPENDENT_AMBULATORY_CARE_PROVIDER_SITE_OTHER): Payer: BC Managed Care – PPO | Admitting: Physician Assistant

## 2022-03-19 ENCOUNTER — Encounter: Payer: Self-pay | Admitting: Physician Assistant

## 2022-03-19 VITALS — BP 146/90 | HR 86 | Temp 97.8°F | Ht 66.5 in | Wt 197.0 lb

## 2022-03-19 DIAGNOSIS — F419 Anxiety disorder, unspecified: Secondary | ICD-10-CM | POA: Diagnosis not present

## 2022-03-19 DIAGNOSIS — I10 Essential (primary) hypertension: Secondary | ICD-10-CM | POA: Diagnosis not present

## 2022-03-19 DIAGNOSIS — Z0000 Encounter for general adult medical examination without abnormal findings: Secondary | ICD-10-CM | POA: Diagnosis not present

## 2022-03-19 DIAGNOSIS — E785 Hyperlipidemia, unspecified: Secondary | ICD-10-CM

## 2022-03-19 DIAGNOSIS — Z1211 Encounter for screening for malignant neoplasm of colon: Secondary | ICD-10-CM

## 2022-03-19 DIAGNOSIS — Z1159 Encounter for screening for other viral diseases: Secondary | ICD-10-CM

## 2022-03-19 MED ORDER — SERTRALINE HCL 50 MG PO TABS: 50.0000 mg | ORAL_TABLET | Freq: Every day | ORAL | 3 refills | Status: DC

## 2022-03-19 MED ORDER — ATORVASTATIN CALCIUM 10 MG PO TABS: 10.0000 mg | ORAL_TABLET | Freq: Every day | ORAL | 3 refills | Status: DC

## 2022-03-19 MED ORDER — AMLODIPINE BESYLATE 10 MG PO TABS: 10.0000 mg | ORAL_TABLET | Freq: Every day | ORAL | 3 refills | Status: DC

## 2022-03-19 NOTE — Patient Instructions (Addendum)
It was great to see you!  Mammogram Colonoscopy Come back for pap smear when you're ready  Two options for blood work today: Make an appointment and come back this afternoon or later this/next week? Go to  [facility] location without a scheduled appointment.  The address is [address]. It is across the street from [facility]. Lab is located in the basement. Hours of operation are M-F 8:30am to 5:00pm. Please note that they are closed for lunch between 12:30 and 1:00pm.   Take care,  Aldona Bar

## 2022-03-19 NOTE — Progress Notes (Signed)
Subjective:    Brandy Valdez is a 56 y.o. female and is here for a comprehensive physical exam.   HPI  Health Maintenance Due  Topic Date Due   Hepatitis C Screening  Never done   COLONOSCOPY (Pts 45-45yr Insurance coverage will need to be confirmed)  Never done   MAMMOGRAM  Never done    Acute Concerns: None  Chronic Issues: Anxiety  Patient is currently taking Zoloft 50 mg daily. Tolerating her medication well without any side effects. Has no complaints about her medications and is overall doing well. She is requesting refills on her medication today. Denies SI/HI.   HTN Currently taking Amlodipine 10 mg daily. At home blood pressure readings are: not checked regularly. Patient denies chest pain, SOB, blurred vision, dizziness, unusual headaches, lower leg swelling. Patient is  compliant with medication. Denies excessive caffeine intake, stimulant usage, excessive alcohol intake, or increase in salt consumption.  BP Readings from Last 3 Encounters:  03/19/22 (!) 146/90  03/04/21 132/84  04/05/20 (!) 142/90    Hyperlipidemia Pt is on Lipitor 10 mg daily. She is compliant with her medication with no complications. Will be updated blood work today.   Health Maintenance: Immunizations -- UTD Colonoscopy -- Due Mammogram --Due-last done 2006 PAP -- Due-she will come back to get this done.  Bone Density -- N/A Diet -- trying to eat healthy.  Sleep habits -- No concern Exercise -- Does like to complete 10,000 steps a day. Weight -- 197 Ib (89.4 kg) Mood -- Better  Weight history: Wt Readings from Last 10 Encounters:  03/19/22 197 lb (89.4 kg)  03/04/21 199 lb 12.8 oz (90.6 kg)  04/05/20 201 lb 3.2 oz (91.3 kg)  03/22/20 201 lb 8 oz (91.4 kg)  05/16/18 196 lb 4 oz (89 kg)  12/27/17 195 lb 8 oz (88.7 kg)  06/29/17 186 lb 6.1 oz (84.5 kg)  01/26/17 186 lb 9.6 oz (84.6 kg)  01/21/17 186 lb 8 oz (84.6 kg)  01/01/17 188 lb 4 oz (85.4 kg)   Body mass index is 31.32  kg/m. Patient's last menstrual period was 04/11/2018. Alcohol use:  reports current alcohol use. Tobacco use: Some days  Tobacco Use: High Risk   Smoking Tobacco Use: Some Days   Smokeless Tobacco Use: Never   Passive Exposure: Not on file        03/19/2022    8:45 AM  Depression screen PHQ 2/9  Decreased Interest 0  Down, Depressed, Hopeless 0  PHQ - 2 Score 0  Altered sleeping 0  Tired, decreased energy 0  Change in appetite 0  Feeling bad or failure about yourself  0  Trouble concentrating 1  Moving slowly or fidgety/restless 0  Suicidal thoughts 0  PHQ-9 Score 1  Difficult doing work/chores Not difficult at all     Other providers/specialists: Patient Care Team: WInda Coke PUtahas PCP - General (Physician Assistant)    PMHx, SurgHx, SocialHx, Medications, and Allergies were reviewed in the Visit Navigator and updated as appropriate.   Past Medical History:  Diagnosis Date   Anxiety    Hypertension      Past Surgical History:  Procedure Laterality Date   ESSURE TUBAL LIGATION Bilateral 2001     Family History  Problem Relation Age of Onset   Hypertension Mother    Kidney cancer Mother    Stroke Father    Heart attack Father    Hypertension Father    Prostate cancer Father  Hypertension Sister    Diabetes Sister    Hypertension Sister    Hypertension Maternal Grandmother    Breast cancer Maternal Grandmother    Hypertension Maternal Grandfather    Hypertension Paternal Grandmother    Hypertension Paternal Grandfather    Tuberculosis Paternal Grandfather     Social History   Tobacco Use   Smoking status: Some Days   Smokeless tobacco: Never   Tobacco comments:    Smoking social use  Substance Use Topics   Alcohol use: Yes    Comment: Occcassionally glass of wine   Drug use: No    Review of Systems:   Review of Systems  Constitutional:  Negative for chills, fever, malaise/fatigue and weight loss.  HENT:  Negative for hearing  loss, sinus pain and sore throat.   Respiratory:  Negative for cough and hemoptysis.   Cardiovascular:  Negative for chest pain, palpitations, leg swelling and PND.  Gastrointestinal:  Negative for abdominal pain, constipation, diarrhea, heartburn, nausea and vomiting.  Genitourinary:  Negative for dysuria, frequency and urgency.  Musculoskeletal:  Negative for back pain, myalgias and neck pain.  Skin:  Negative for itching and rash.  Neurological:  Negative for dizziness, tingling, seizures and headaches.  Endo/Heme/Allergies:  Negative for polydipsia.  Psychiatric/Behavioral:  Negative for depression. The patient is not nervous/anxious.     Objective:   BP (!) 146/90 (BP Location: Left Arm, Patient Position: Sitting, Cuff Size: Normal)   Pulse 86   Temp 97.8 F (36.6 C) (Temporal)   Ht 5' 6.5" (1.689 m)   Wt 197 lb (89.4 kg)   LMP 04/11/2018   SpO2 96%   BMI 31.32 kg/m  Body mass index is 31.32 kg/m.   General Appearance:    Alert, cooperative, no distress, appears stated age  Head:    Normocephalic, without obvious abnormality, atraumatic  Eyes:    PERRL, conjunctiva/corneas clear, EOM's intact, fundi    benign, both eyes  Ears:    Normal TM's and external ear canals, both ears  Nose:   Nares normal, septum midline, mucosa normal, no drainage    or sinus tenderness  Throat:   Lips, mucosa, and tongue normal; teeth and gums normal  Neck:   Supple, symmetrical, trachea midline, no adenopathy;    thyroid:  no enlargement/tenderness/nodules; no carotid   bruit or JVD  Back:     Symmetric, no curvature, ROM normal, no CVA tenderness  Lungs:     Clear to auscultation bilaterally, respirations unlabored  Chest Wall:    No tenderness or deformity   Heart:    Regular rate and rhythm, S1 and S2 normal, no murmur, rub or gallop  Breast Exam:    Deferred  Abdomen:     Soft, non-tender, bowel sounds active all four quadrants,    no masses, no organomegaly  Genitalia:    Deferred   Extremities:   Extremities normal, atraumatic, no cyanosis or edema  Pulses:   2+ and symmetric all extremities  Skin:   Skin color, texture, turgor normal, no rashes or lesions  Lymph nodes:   Cervical, supraclavicular, and axillary nodes normal  Neurologic:   CNII-XII intact, normal strength, sensation and reflexes    throughout    Assessment/Plan:   Routine physical examination Today patient counseled on age appropriate routine health concerns for screening and prevention, each reviewed and up to date or declined. Immunizations reviewed and up to date or declined. Labs ordered and reviewed. Risk factors for depression reviewed and negative.  Hearing function and visual acuity are intact. ADLs screened and addressed as needed. Functional ability and level of safety reviewed and appropriate. Education, counseling and referrals performed based on assessed risks today. Patient provided with a copy of personalized plan for preventive services.  Screening for colon cancer Colonoscopy ordered  Essential hypertension Above goal, but not at dangerous level and patient is asymptomatic Recommend checking BP consistently at home and if numbers >140/90, to follow-up with [place] Continue amlodopine 10 mg daily Follow-up in 6 months to 1 year.  Anxiety Well controlled, continue zoloft 50 mg daily Follow-up in 6 months to 1 year  Hyperlipidemia, unspecified hyperlipidemia type Update lipid panel and adjust lipitor 10 mg daily Follow-up in 6 months to 1 year  Encounter for screening for other viral diseases Update Hep C screening  Patient Counseling: '[x]'$    Nutrition: Stressed importance of moderation in sodium/caffeine intake, saturated fat and cholesterol, caloric balance, sufficient intake of fresh fruits, vegetables, fiber, calcium, iron, and 1 mg of folate supplement per day (for females capable of pregnancy).  '[x]'$    Stressed the importance of regular exercise.   '[x]'$    Substance Abuse:  Discussed cessation/primary prevention of tobacco, alcohol, or other drug use; driving or other dangerous activities under the influence; availability of treatment for abuse.   '[x]'$    Injury prevention: Discussed safety belts, safety helmets, smoke detector, smoking near bedding or upholstery.   '[x]'$    Sexuality: Discussed sexually transmitted diseases, partner selection, use of condoms, avoidance of unintended pregnancy  and contraceptive alternatives.  '[x]'$    Dental health: Discussed importance of regular tooth brushing, flossing, and dental visits.  '[x]'$    Health maintenance and immunizations reviewed. Please refer to Health maintenance section.    I,Savera Zaman,acting as a Education administrator for [employer], PA.,have documented all relevant documentation on the behalf of Inda Coke, PA,as directed by  Inda Coke, PA while in the presence of Inda Coke, [place].   I, Inda Coke, [place], have reviewed all documentation for this visit. The documentation on 03/19/22 for the exam, diagnosis, procedures, and orders are all accurate and complete.  Inda Coke, PA-C [facility]

## 2022-03-30 ENCOUNTER — Ambulatory Visit: Payer: BC Managed Care – PPO | Admitting: Dermatology

## 2022-03-30 ENCOUNTER — Encounter: Payer: Self-pay | Admitting: Dermatology

## 2022-03-30 DIAGNOSIS — L570 Actinic keratosis: Secondary | ICD-10-CM

## 2022-03-30 DIAGNOSIS — Z1283 Encounter for screening for malignant neoplasm of skin: Secondary | ICD-10-CM | POA: Diagnosis not present

## 2022-04-20 ENCOUNTER — Encounter: Payer: Self-pay | Admitting: Dermatology

## 2022-04-20 NOTE — Progress Notes (Signed)
   New Patient   Subjective  Brandy Valdez is a 56 y.o. female who presents for the following: Skin Problem (New lesion on left upper lip x 1 week- wont' go away).  Spot left upper lip, check other spots Location:  Duration:  Quality:  Associated Signs/Symptoms: Modifying Factors:  Severity:  Timing: Context:    The following portions of the chart were reviewed this encounter and updated as appropriate:  Tobacco  Allergies  Meds  Problems  Med Hx  Surg Hx  Fam Hx      Objective  Well appearing patient in no apparent distress; mood and affect are within normal limits.   A focused examination was performed including head, neck, arms, back. Relevant physical exam findings are noted in the Assessment and Plan.   Assessment & Plan  Solar keratosis Left Upper Cutaneous Lip  Destruction of lesion - Left Upper Cutaneous Lip Complexity: simple   Destruction method: cryotherapy   Informed consent: discussed and consent obtained   Timeout:  patient name, date of birth, surgical site, and procedure verified Lesion destroyed using liquid nitrogen: Yes   Outcome: patient tolerated procedure well with no complications

## 2022-06-10 DIAGNOSIS — M25522 Pain in left elbow: Secondary | ICD-10-CM | POA: Diagnosis not present

## 2022-08-27 DIAGNOSIS — M7712 Lateral epicondylitis, left elbow: Secondary | ICD-10-CM | POA: Diagnosis not present

## 2022-09-07 DIAGNOSIS — H11433 Conjunctival hyperemia, bilateral: Secondary | ICD-10-CM | POA: Diagnosis not present

## 2022-09-07 DIAGNOSIS — H1132 Conjunctival hemorrhage, left eye: Secondary | ICD-10-CM | POA: Diagnosis not present

## 2022-09-11 DIAGNOSIS — I10 Essential (primary) hypertension: Secondary | ICD-10-CM | POA: Diagnosis not present

## 2022-09-11 DIAGNOSIS — R6889 Other general symptoms and signs: Secondary | ICD-10-CM | POA: Diagnosis not present

## 2022-09-11 DIAGNOSIS — R062 Wheezing: Secondary | ICD-10-CM | POA: Diagnosis not present

## 2023-04-05 ENCOUNTER — Other Ambulatory Visit: Payer: Self-pay | Admitting: Physician Assistant

## 2023-04-12 ENCOUNTER — Telehealth: Payer: Self-pay | Admitting: Physician Assistant

## 2023-04-12 MED ORDER — ATORVASTATIN CALCIUM 10 MG PO TABS: 10.0000 mg | ORAL_TABLET | Freq: Every day | ORAL | 0 refills | Status: DC

## 2023-04-12 MED ORDER — SERTRALINE HCL 50 MG PO TABS: 50.0000 mg | ORAL_TABLET | Freq: Every day | ORAL | 0 refills | Status: DC

## 2023-04-12 MED ORDER — AMLODIPINE BESYLATE 10 MG PO TABS: 10.0000 mg | ORAL_TABLET | Freq: Every day | ORAL | 0 refills | Status: DC

## 2023-04-12 NOTE — Telephone Encounter (Signed)
Spoke to pt told her Rx's were sent to the pharmacy. Pt verbalized understanding. 

## 2023-04-12 NOTE — Telephone Encounter (Signed)
Prescription Request  04/12/2023  LOV: Visit date not found  What is the name of the medication or equipment?  sertraline (ZOLOFT) 50 MG tablet   amLODipine (NORVASC) 10 MG tablet   atorvastatin (LIPITOR) 10 MG tablet    Have you contacted your pharmacy to request a refill? Yes   Which pharmacy would you like this sent to?  [facility] [id number] - [place], [place] - [address] [place] [address] Phone: [phone] Fax: [phone]    Patient notified that their request is being sent to the clinical staff for review and that they should receive a response within 2 business days.   Please advise at Mobile [phone] (mobile)

## 2023-05-03 ENCOUNTER — Other Ambulatory Visit: Payer: Self-pay | Admitting: Physician Assistant

## 2023-05-03 DIAGNOSIS — Z1231 Encounter for screening mammogram for malignant neoplasm of breast: Secondary | ICD-10-CM

## 2023-05-04 ENCOUNTER — Ambulatory Visit (INDEPENDENT_AMBULATORY_CARE_PROVIDER_SITE_OTHER): Payer: BC Managed Care – PPO | Admitting: Physician Assistant

## 2023-05-04 ENCOUNTER — Encounter: Payer: Self-pay | Admitting: Physician Assistant

## 2023-05-04 VITALS — BP 150/100 | HR 87 | Temp 97.8°F | Ht 66.0 in | Wt 203.2 lb

## 2023-05-04 DIAGNOSIS — Z720 Tobacco use: Secondary | ICD-10-CM

## 2023-05-04 DIAGNOSIS — E669 Obesity, unspecified: Secondary | ICD-10-CM

## 2023-05-04 DIAGNOSIS — Z1211 Encounter for screening for malignant neoplasm of colon: Secondary | ICD-10-CM

## 2023-05-04 DIAGNOSIS — F419 Anxiety disorder, unspecified: Secondary | ICD-10-CM

## 2023-05-04 DIAGNOSIS — E785 Hyperlipidemia, unspecified: Secondary | ICD-10-CM

## 2023-05-04 DIAGNOSIS — Z0000 Encounter for general adult medical examination without abnormal findings: Secondary | ICD-10-CM | POA: Diagnosis not present

## 2023-05-04 DIAGNOSIS — G9331 Postviral fatigue syndrome: Secondary | ICD-10-CM

## 2023-05-04 DIAGNOSIS — E559 Vitamin D deficiency, unspecified: Secondary | ICD-10-CM

## 2023-05-04 DIAGNOSIS — I10 Essential (primary) hypertension: Secondary | ICD-10-CM

## 2023-05-04 MED ORDER — LOSARTAN POTASSIUM 25 MG PO TABS: 25.0000 mg | ORAL_TABLET | Freq: Every day | ORAL | 0 refills | Status: DC

## 2023-05-04 NOTE — Progress Notes (Signed)
Subjective:    Brandy Valdez is a 57 y.o. female and is here for a comprehensive physical exam.  HPI  Health Maintenance Due  Topic Date Due   MAMMOGRAM  Never done    Acute Concerns: Postviral fatigue She complains of fatigue and cough since she recovered from Covid-19.  Overall symptom(s) are improving She denies shortness of breathe, chest pain.  She possibly had Covid-19 in the past but did not test. Her recent case of Covid-19 was the first time she tested Positive.   Chronic Issues: HTN Her blood pressure is elevated during this visit.  She continues taking 10 mg amlodipine daily PO and reports no new issues while taking it.  She denies any new leg swelling.  BP Readings from Last 3 Encounters:  05/04/23 (!) 150/100  03/19/22 (!) 146/90  03/04/21 132/84   Pulse Readings from Last 3 Encounters:  05/04/23 87  03/19/22 86  03/04/21 76   HLD Currently taking Lipitor 10 mg daily Tolerating well  Anxiety Currently taking Zoloft 50 mg daily Denies suicidal ideation/hi   Family medical history:  Most members of her family has a history of high blood pressure.   Social history:  She drinks around 2 glasses of wine every night.  She drinks 8-10 glasses of water daily.  She smokes cigarettes occasionally when stressed.  She started smoking cigarettes from college until Jun 01, 1997 when she was pregnant with her first child and did not start smoking again until 01-Jun-2013 when her mother passed away.  She has 5-6 cigarettes daily depending on how stressed she is.   Health Maintenance: Immunizations -- N/A Colonoscopy -- Colonoscopy scheduled.  Mammogram -- Mammogram scheduled.  PAP -- N/A Bone Density -- N/A Diet -- She is managing a healthy diet and is drinking plenty of water daily. Exercise -- She is not doing strength training exercises.  She is getting physical activity from working 3 jobs daily and walking at least 10000 steps daily.   Sleep habits -- She reports  no new issues with sleep.  Mood -- N/A  UTD with dentist? - Yes UTD with eye doctor? - Yes  Weight history: Wt Readings from Last 10 Encounters:  05/04/23 203 lb 4 oz (92.2 kg)  03/19/22 197 lb (89.4 kg)  03/04/21 199 lb 12.8 oz (90.6 kg)  04/05/20 201 lb 3.2 oz (91.3 kg)  03/22/20 201 lb 8 oz (91.4 kg)  05/16/18 196 lb 4 oz (89 kg)  12/27/17 195 lb 8 oz (88.7 kg)  06/29/17 186 lb 6.1 oz (84.5 kg)  01/26/17 186 lb 9.6 oz (84.6 kg)  01/21/17 186 lb 8 oz (84.6 kg)   Body mass index is 32.81 kg/m. Patient's last menstrual period was 04/11/2018.  Alcohol use:  reports current alcohol use.  Tobacco use:  Tobacco Use: High Risk (05/04/2023)   Patient History    Smoking Tobacco Use: Some Days    Smokeless Tobacco Use: Never    Passive Exposure: Not on file   Eligible for lung cancer screening? no     05/04/2023    1:49 PM  Depression screen PHQ 2/9  Decreased Interest 0  Down, Depressed, Hopeless 0  PHQ - 2 Score 0     Other providers/specialists: Patient Care Team: Jarold Motto, [place] as PCP - General (Physician Assistant) Janalyn Harder, MD (Inactive) as Consulting Physician (Dermatology)    PMHx, SurgHx, SocialHx, Medications, and Allergies were reviewed in the Visit Navigator and updated as appropriate.   Past Medical  History:  Diagnosis Date   Anxiety    Atypical mole 04/15/2005   Center Upper Back   Atypical mole 04/15/2005   Left Low Back   Hypertension    Squamous cell carcinoma of skin 01/31/2018   Left Cheek (Keratoacanthoma) (MOH's)     Past Surgical History:  Procedure Laterality Date   ESSURE TUBAL LIGATION Bilateral 2001     Family History  Problem Relation Age of Onset   Hypertension Mother    Kidney cancer Mother    Stroke Father    Heart attack Father    Hypertension Father    Prostate cancer Father    Hypertension Sister    Diabetes Sister    Hypertension Sister    Hypertension Maternal Grandmother    Breast cancer Maternal  Grandmother    Hypertension Maternal Grandfather    Hypertension Paternal Grandmother    Hypertension Paternal Grandfather    Tuberculosis Paternal Grandfather     Social History   Tobacco Use   Smoking status: Some Days   Smokeless tobacco: Never   Tobacco comments:    Smoking social use  Substance Use Topics   Alcohol use: Yes    Comment: Occcassionally glass of wine   Drug use: No    Review of Systems:   Review of Systems  Constitutional:  Positive for malaise/fatigue.  Respiratory:  Positive for cough. Negative for shortness of breath.   Cardiovascular:  Negative for leg swelling.    Objective:   BP (!) 150/100 (BP Location: Left Arm, Patient Position: Sitting, Cuff Size: Large)   Pulse 87   Temp 97.8 F (36.6 C) (Temporal)   Ht 5\' 6"  (1.676 m)   Wt 203 lb 4 oz (92.2 kg)   LMP 04/11/2018   SpO2 96%   BMI 32.81 kg/m  Body mass index is 32.81 kg/m.   General Appearance:    Alert, cooperative, no distress, appears stated age  Head:    Normocephalic, without obvious abnormality, atraumatic  Eyes:    PERRL, conjunctiva/corneas clear, EOM's intact, fundi    benign, both eyes  Ears:    Normal TM's and external ear canals, both ears  Nose:   Nares normal, septum midline, mucosa normal, no drainage    or sinus tenderness  Throat:   Lips, mucosa, and tongue normal; teeth and gums normal  Neck:   Supple, symmetrical, trachea midline, no adenopathy;    thyroid:  no enlargement/tenderness/nodules; no carotid   bruit or JVD  Back:     Symmetric, no curvature, ROM normal, no CVA tenderness  Lungs:     Clear to auscultation bilaterally, respirations unlabored  Chest Wall:    No tenderness or deformity   Heart:    Regular rate and rhythm, S1 and S2 normal, no murmur, rub or gallop  Breast Exam:    Deferred  Abdomen:     Soft, non-tender, bowel sounds active all four quadrants,    no masses, no organomegaly  Genitalia:    Deferred  Extremities:   Extremities normal,  atraumatic, no cyanosis or edema  Pulses:   2+ and symmetric all extremities  Skin:   Skin color, texture, turgor normal, no rashes or lesions  Lymph nodes:   Cervical, supraclavicular, and axillary nodes normal  Neurologic:   CNII-XII intact, normal strength, sensation and reflexes    throughout    Assessment/Plan:   Routine physical examination Today patient counseled on age appropriate routine health concerns for screening and prevention, each reviewed and  up to date or declined. Immunizations reviewed and up to date or declined. Labs ordered and reviewed. Risk factors for depression reviewed and negative. Hearing function and visual acuity are intact. ADLs screened and addressed as needed. Functional ability and level of safety reviewed and appropriate. Education, counseling and referrals performed based on assessed risks today. Patient provided with a copy of personalized plan for preventive services.  Essential hypertension Above goal today No evidence of end-organ damage on my exam Recommend patient monitor home blood pressure at least a few times weekly Continue amlodipine 10 mg daily add losartan 25 mg daily Recommend reach out to [place] in 1 month with blood pressure averages If home monitoring shows consistent elevation, or any symptom(s) develop, recommend reach out to [place] for further advice on next steps  Obesity, unspecified classification, unspecified obesity type, unspecified whether serious comorbidity present Continue healthy lifestyle efforts  Special screening for malignant neoplasms, colon Colonoscopy ordered  Hyperlipidemia, unspecified hyperlipidemia type Update lipid panel and adjust Lipitor 10 mg daily  Vitamin D deficiency Update Vitamin D and provide recommendations  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  I, Jarold Motto, [place], have reviewed all documentation for this visit. The documentation on 05/04/23 for the exam, diagnosis, procedures, and orders are all accurate and complete.  Jarold Motto, PA-C [facility]

## 2023-05-04 NOTE — Patient Instructions (Addendum)
It was great to see you!  add in Losartan 25 mg daily and keep an eye on your blood pressure -- send me a MyChart message in a month or so with your average readings so I know whether or not we are on the right track  Please go to the lab for blood work.   Our office will call you with your results unless you have chosen to receive results via MyChart.  If your blood work is normal we will follow-up each year for physicals and as scheduled for chronic medical problems.  If anything is abnormal we will treat accordingly and get you in for a follow-up.  Take care,  Lelon Mast

## 2023-05-11 ENCOUNTER — Other Ambulatory Visit: Payer: Self-pay | Admitting: Physician Assistant

## 2023-05-11 ENCOUNTER — Other Ambulatory Visit (INDEPENDENT_AMBULATORY_CARE_PROVIDER_SITE_OTHER): Payer: BC Managed Care – PPO

## 2023-05-11 DIAGNOSIS — E785 Hyperlipidemia, unspecified: Secondary | ICD-10-CM

## 2023-05-11 DIAGNOSIS — R7989 Other specified abnormal findings of blood chemistry: Secondary | ICD-10-CM

## 2023-05-11 DIAGNOSIS — E559 Vitamin D deficiency, unspecified: Secondary | ICD-10-CM

## 2023-05-11 DIAGNOSIS — I10 Essential (primary) hypertension: Secondary | ICD-10-CM

## 2023-05-11 DIAGNOSIS — R7309 Other abnormal glucose: Secondary | ICD-10-CM

## 2023-05-11 LAB — CBC WITH DIFFERENTIAL/PLATELET
Basophils Absolute: 0.1 10*3/uL (ref 0.0–0.1)
Basophils Relative: 0.8 % (ref 0.0–3.0)
Eosinophils Absolute: 0.2 10*3/uL (ref 0.0–0.7)
Eosinophils Relative: 2.7 % (ref 0.0–5.0)
HCT: 42.4 % (ref 36.0–46.0)
Hemoglobin: 14.1 g/dL (ref 12.0–15.0)
Lymphocytes Relative: 39.4 % (ref 12.0–46.0)
Lymphs Abs: 3.2 10*3/uL (ref 0.7–4.0)
MCHC: 33.2 g/dL (ref 30.0–36.0)
MCV: 100.2 fl — ABNORMAL HIGH (ref 78.0–100.0)
Monocytes Absolute: 0.7 10*3/uL (ref 0.1–1.0)
Monocytes Relative: 8.3 % (ref 3.0–12.0)
Neutro Abs: 4 10*3/uL (ref 1.4–7.7)
Neutrophils Relative %: 48.8 % (ref 43.0–77.0)
Platelets: 293 10*3/uL (ref 150.0–400.0)
RBC: 4.24 Mil/uL (ref 3.87–5.11)
RDW: 12.3 % (ref 11.5–15.5)
WBC: 8.1 10*3/uL (ref 4.0–10.5)

## 2023-05-11 LAB — COMPREHENSIVE METABOLIC PANEL
ALT: 62 U/L — ABNORMAL HIGH (ref 0–35)
AST: 38 U/L — ABNORMAL HIGH (ref 0–37)
Albumin: 4.4 g/dL (ref 3.5–5.2)
Alkaline Phosphatase: 74 U/L (ref 39–117)
BUN: 15 mg/dL (ref 6–23)
CO2: 25 mEq/L (ref 19–32)
Calcium: 9.2 mg/dL (ref 8.4–10.5)
Chloride: 103 mEq/L (ref 96–112)
Creatinine, Ser: 0.58 mg/dL (ref 0.40–1.20)
GFR: 100.78 mL/min (ref >60.00)
Glucose, Bld: 135 mg/dL — ABNORMAL HIGH (ref 70–99)
Potassium: 4.1 mEq/L (ref 3.5–5.1)
Sodium: 138 mEq/L (ref 135–145)
Total Bilirubin: 0.4 mg/dL (ref 0.2–1.2)
Total Protein: 6.7 g/dL (ref 6.0–8.3)

## 2023-05-11 LAB — LIPID PANEL
Cholesterol: 174 mg/dL (ref 0–200)
HDL: 44 mg/dL (ref >39.00)
LDL Cholesterol: 91 mg/dL (ref 0–99)
NonHDL: 129.68
Total CHOL/HDL Ratio: 4
Triglycerides: 192 mg/dL — ABNORMAL HIGH (ref 0.0–149.0)
VLDL: 38.4 mg/dL (ref 0.0–40.0)

## 2023-05-11 LAB — VITAMIN D 25 HYDROXY (VIT D DEFICIENCY, FRACTURES): VITD: 33.13 ng/mL (ref 30.00–100.00)

## 2023-05-25 ENCOUNTER — Ambulatory Visit
Admission: RE | Admit: 2023-05-25 | Discharge: 2023-05-25 | Disposition: A | Discharge status: Home / Self Care | Payer: BC Managed Care – PPO | Source: Ambulatory Visit | Attending: Physician Assistant | Admitting: Physician Assistant

## 2023-05-25 DIAGNOSIS — Z1231 Encounter for screening mammogram for malignant neoplasm of breast: Secondary | ICD-10-CM

## 2023-05-28 ENCOUNTER — Other Ambulatory Visit: Payer: Self-pay | Admitting: Physician Assistant

## 2023-05-28 DIAGNOSIS — R928 Other abnormal and inconclusive findings on diagnostic imaging of breast: Secondary | ICD-10-CM

## 2023-06-08 ENCOUNTER — Ambulatory Visit
Admission: RE | Admit: 2023-06-08 | Discharge: 2023-06-08 | Disposition: A | Discharge status: Home / Self Care | Payer: BC Managed Care – PPO | Source: Ambulatory Visit | Attending: Physician Assistant | Admitting: Physician Assistant

## 2023-06-08 ENCOUNTER — Other Ambulatory Visit: Payer: Self-pay | Admitting: Physician Assistant

## 2023-06-08 DIAGNOSIS — R928 Other abnormal and inconclusive findings on diagnostic imaging of breast: Secondary | ICD-10-CM

## 2023-06-08 DIAGNOSIS — R921 Mammographic calcification found on diagnostic imaging of breast: Secondary | ICD-10-CM | POA: Diagnosis not present

## 2023-06-10 ENCOUNTER — Other Ambulatory Visit (INDEPENDENT_AMBULATORY_CARE_PROVIDER_SITE_OTHER): Payer: BC Managed Care – PPO

## 2023-06-10 DIAGNOSIS — R7989 Other specified abnormal findings of blood chemistry: Secondary | ICD-10-CM

## 2023-06-10 DIAGNOSIS — R7309 Other abnormal glucose: Secondary | ICD-10-CM

## 2023-06-10 LAB — COMPREHENSIVE METABOLIC PANEL
ALT: 73 U/L — ABNORMAL HIGH (ref 0–35)
AST: 42 U/L — ABNORMAL HIGH (ref 0–37)
Albumin: 4.5 g/dL (ref 3.5–5.2)
Alkaline Phosphatase: 73 U/L (ref 39–117)
BUN: 13 mg/dL (ref 6–23)
CO2: 27 mEq/L (ref 19–32)
Calcium: 9.7 mg/dL (ref 8.4–10.5)
Chloride: 103 mEq/L (ref 96–112)
Creatinine, Ser: 0.55 mg/dL (ref 0.40–1.20)
GFR: 102.02 mL/min (ref >60.00)
Glucose, Bld: 105 mg/dL — ABNORMAL HIGH (ref 70–99)
Potassium: 3.9 mEq/L (ref 3.5–5.1)
Sodium: 138 mEq/L (ref 135–145)
Total Bilirubin: 0.2 mg/dL (ref 0.2–1.2)
Total Protein: 7.2 g/dL (ref 6.0–8.3)

## 2023-06-10 LAB — HEMOGLOBIN A1C: Hgb A1c MFr Bld: 6.2 % (ref 4.6–6.5)

## 2023-06-16 ENCOUNTER — Encounter: Payer: Self-pay | Admitting: Physician Assistant

## 2023-06-16 ENCOUNTER — Telehealth (INDEPENDENT_AMBULATORY_CARE_PROVIDER_SITE_OTHER): Payer: BC Managed Care – PPO | Admitting: Physician Assistant

## 2023-06-16 VITALS — Ht 66.0 in | Wt 196.0 lb

## 2023-06-16 DIAGNOSIS — R7989 Other specified abnormal findings of blood chemistry: Secondary | ICD-10-CM | POA: Diagnosis not present

## 2023-06-16 DIAGNOSIS — I10 Essential (primary) hypertension: Secondary | ICD-10-CM | POA: Diagnosis not present

## 2023-06-16 MED ORDER — LOSARTAN POTASSIUM 50 MG PO TABS: 50.0000 mg | ORAL_TABLET | Freq: Every day | ORAL | 1 refills | Status: DC

## 2023-06-16 NOTE — Progress Notes (Signed)
I acted as a Neurosurgeon for [employer], PA-C Corky Mull, LPN  Virtual Visit via Video Note   I, Jarold Motto, PA, connected with  Brandy Valdez  ([id number], 24-Jun-1966) on 06/16/23 at  8:40 AM EDT by a video-enabled telemedicine application and verified that I am speaking with the correct person using two identifiers.  Location: Patient: Home Provider: [facility] office   I discussed the limitations of evaluation and management by telemedicine and the availability of in person appointments. The patient expressed understanding and agreed to proceed.    History of Present Illness: Brandy Valdez is a 57 y.o. who identifies as a female who was assigned female at birth, and is being seen today for to discuss lab results from 8/22.  Patient reports that she has made significant changes to her health since last being here She has worked on reduction of carbohydrates and wine. She is working on consistent exercise and has lost about 7 pounds since last seeing me  HTN Currently taking losartan 25 mg and amlodipine 10 mg. At home blood pressure readings are: around 140/90. Patient denies chest pain, SOB, blurred vision, dizziness, unusual headaches, lower leg swelling. Patient is compliant with medication. Denies excessive caffeine intake, stimulant usage, excessive alcohol intake, or increase in salt consumption.  BP Readings from Last 3 Encounters:  05/04/23 (!) 150/100  03/19/22 (!) 146/90  03/04/21 132/84     Problems:  Patient Active Problem List   Diagnosis Date Noted   Tobacco abuse 05/16/2018   Obesity 05/16/2018   Hyperlipidemia 05/16/2018   Chest pain with low risk for cardiac etiology 01/26/2017   Essential hypertension 01/21/2017   Anxiety 01/21/2017    Allergies:  Allergies  Allergen Reactions   Penicillins Rash    Has patient had a PCN reaction causing immediate rash, facial/tongue/throat swelling, SOB or lightheadedness with hypotension:  Yes Has patient had a PCN reaction causing severe rash involving mucus membranes or skin necrosis: No Has patient had a PCN reaction that required hospitalization No Has patient had a PCN reaction occurring within the last 10 years: No If all of the above answers are "NO", then may proceed with Cephalosporin use.   Medications:  Current Outpatient Medications:    amLODipine (NORVASC) 10 MG tablet, Take 1 tablet (10 mg total) by mouth daily., Disp: 90 tablet, Rfl: 0   atorvastatin (LIPITOR) 10 MG tablet, Take 1 tablet (10 mg total) by mouth daily., Disp: 90 tablet, Rfl: 0   ibuprofen (ADVIL,MOTRIN) 200 MG tablet, Take 600 mg by mouth 2 (two) times daily as needed for headache (pain)., Disp: , Rfl:    Melatonin 3 MG TABS, Take 3 mg by mouth at bedtime., Disp: , Rfl:    Multiple Vitamins-Minerals (EMERGEN-C VITAMIN C PO), Take 1 tablet by mouth See admin instructions. Take 1 tablet by mouth daily during cold and flu season to boost immune system, Disp: , Rfl:    oxymetazoline (AFRIN) 0.05 % nasal spray, Place 2 sprays into both nostrils 2 (two) times daily., Disp: , Rfl:    sertraline (ZOLOFT) 50 MG tablet, Take 1 tablet (50 mg total) by mouth daily., Disp: 90 tablet, Rfl: 0   Turmeric 500 MG CAPS, Take 1 capsule by mouth daily., Disp: , Rfl:    losartan (COZAAR) 50 MG tablet, Take 1 tablet (50 mg total) by mouth daily., Disp: 90 tablet, Rfl: 1  Observations/Objective: Patient is well-developed, well-nourished in no acute distress.  Resting comfortably  at home.  Head is normocephalic, atraumatic.  No labored breathing.  Speech is clear and coherent with logical content.  Patient is alert and oriented at baseline.   Assessment and Plan: 1. Elevated LFTs She has made great lifestyle changes since I last saw her Will have hold her statin out of abundance of caution Recheck blood work in 4-6 weeks and if still elevating, will obtain liver ultrasound  2. Essential hypertension Above goal  today No evidence of end-organ damage on my exam Recommend patient monitor home blood pressure at least a few times weekly Continue amlodipine 10 mg daily and increase losartan to 50 mg daily If home monitoring shows consistent elevation, or any symptom(s) develop, recommend reach out to [place] for further advice on next steps   Follow Up Instructions: I discussed the assessment and treatment plan with the patient. The patient was provided an opportunity to ask questions and all were answered. The patient agreed with the plan and demonstrated an understanding of the instructions.  A copy of instructions were sent to the patient via MyChart unless otherwise noted below.   The patient was advised to call back or seek an in-person evaluation if the symptoms worsen or if the condition fails to improve as anticipated.  Jarold Motto, [place]

## 2023-06-28 ENCOUNTER — Ambulatory Visit (AMBULATORY_SURGERY_CENTER): Payer: BC Managed Care – PPO

## 2023-06-28 VITALS — Ht 66.0 in | Wt 196.0 lb

## 2023-06-28 DIAGNOSIS — Z1211 Encounter for screening for malignant neoplasm of colon: Secondary | ICD-10-CM

## 2023-06-28 MED ORDER — NA SULFATE-K SULFATE-MG SULF 17.5-3.13-1.6 GM/177ML PO SOLN
1.0000 | Freq: Once | ORAL | 0 refills | Status: AC
Start: 2023-06-28 — End: 2023-06-28

## 2023-06-28 NOTE — Progress Notes (Signed)
No egg or soy allergy known to patient  No issues known to pt with past sedation with any surgeries or procedures Patient denies ever being told they had issues or difficulty with intubation  No FH of Malignant Hyperthermia Pt is not on diet pills Pt is not on  home 02  Pt is not on blood thinners  Pt denies issues with constipation  No A fib or A flutter Have any cardiac testing pending--no  LOA: independent  Prep: suprep   Patient's chart reviewed by Cathlyn Parsons CNRA prior to previsit and patient appropriate for the [facility].  Previsit completed and red dot placed by patient's name on their procedure day (on provider's schedule).     PV competed with patient. Prep instructions sent via mychart and home address. Goodrx coupon for [employer] and [facility] provided to use for price reduction if needed.

## 2023-06-29 ENCOUNTER — Encounter: Payer: Self-pay | Admitting: Gastroenterology

## 2023-07-07 ENCOUNTER — Other Ambulatory Visit: Payer: Self-pay | Admitting: Physician Assistant

## 2023-07-12 ENCOUNTER — Encounter: Payer: Self-pay | Admitting: Gastroenterology

## 2023-07-12 ENCOUNTER — Ambulatory Visit: Payer: BC Managed Care – PPO | Admitting: Gastroenterology

## 2023-07-12 VITALS — BP 123/77 | HR 74 | Temp 98.8°F | Resp 17 | Ht 66.0 in | Wt 196.0 lb

## 2023-07-12 DIAGNOSIS — D123 Benign neoplasm of transverse colon: Secondary | ICD-10-CM

## 2023-07-12 DIAGNOSIS — D120 Benign neoplasm of cecum: Secondary | ICD-10-CM | POA: Diagnosis not present

## 2023-07-12 DIAGNOSIS — Z1211 Encounter for screening for malignant neoplasm of colon: Secondary | ICD-10-CM | POA: Diagnosis not present

## 2023-07-12 DIAGNOSIS — K635 Polyp of colon: Secondary | ICD-10-CM

## 2023-07-12 DIAGNOSIS — K5730 Diverticulosis of large intestine without perforation or abscess without bleeding: Secondary | ICD-10-CM

## 2023-07-12 MED ORDER — SODIUM CHLORIDE 0.9 % IV SOLN: 500.0000 mL | Freq: Once | INTRAVENOUS | Status: DC

## 2023-07-12 NOTE — Patient Instructions (Signed)

## 2023-07-12 NOTE — Progress Notes (Signed)
Called to room to assist during endoscopic procedure.  Patient ID and intended procedure confirmed with present staff. Received instructions for my participation in the procedure from the performing physician.  

## 2023-07-12 NOTE — Progress Notes (Signed)
GASTROENTEROLOGY PROCEDURE H&P NOTE   Primary Care Physician: Jarold Motto, PA    Reason for Procedure:  Colon Cancer screening  Plan:    Colonoscopy  Patient is appropriate for endoscopic procedure(s) in the ambulatory ([facility]) setting.  The nature of the procedure, as well as the risks, benefits, and alternatives were carefully and thoroughly reviewed with the patient. Ample time for discussion and questions allowed. The patient understood, was satisfied, and agreed to proceed.     HPI: Brandy Valdez is a 57 y.o. female who presents for colonoscopy for routine Colon Cancer screening.  No active GI symptoms.  No known family history of colon cancer or related malignancy.  Patient is otherwise without complaints or active issues today.  Past Medical History:  Diagnosis Date   Anxiety    Atypical mole 04/15/2005   Center Upper Back   Atypical mole 04/15/2005   Left Low Back   Hypertension    Squamous cell carcinoma of skin 01/31/2018   Left Cheek (Keratoacanthoma) (MOH's)    Past Surgical History:  Procedure Laterality Date   ESSURE TUBAL LIGATION Bilateral 2001    Prior to Admission medications   Medication Sig Start Date End Date Taking? Authorizing Provider  amLODipine (NORVASC) 10 MG tablet TAKE 1 TABLET BY MOUTH DAILY 07/07/23  Yes Bufford Buttner, [place], PA  atorvastatin (LIPITOR) 10 MG tablet TAKE 1 TABLET BY MOUTH DAILY 07/07/23  Yes Jarold Motto, PA  losartan (COZAAR) 50 MG tablet Take 1 tablet (50 mg total) by mouth daily. 06/16/23  Yes Jarold Motto, PA  Melatonin 3 MG TABS Take 3 mg by mouth at bedtime.   Yes [provider]  oxymetazoline (AFRIN) 0.05 % nasal spray Place 2 sprays into both nostrils 2 (two) times daily.   Yes [provider]  sertraline (ZOLOFT) 50 MG tablet TAKE 1 TABLET BY MOUTH DAILY 07/07/23  Yes Jarold Motto, PA  ibuprofen (ADVIL,MOTRIN) 200 MG tablet Take 600 mg by mouth 2 (two) times daily as needed for headache  (pain).    [provider]  Multiple Vitamins-Minerals (EMERGEN-C VITAMIN C PO) Take 1 tablet by mouth See admin instructions. Take 1 tablet by mouth daily during cold and flu season to boost immune system    [provider]  Turmeric 500 MG CAPS Take 1 capsule by mouth daily.    [provider]    Current Outpatient Medications  Medication Sig Dispense Refill   amLODipine (NORVASC) 10 MG tablet TAKE 1 TABLET BY MOUTH DAILY 30 tablet 5   atorvastatin (LIPITOR) 10 MG tablet TAKE 1 TABLET BY MOUTH DAILY 30 tablet 5   losartan (COZAAR) 50 MG tablet Take 1 tablet (50 mg total) by mouth daily. 90 tablet 1   Melatonin 3 MG TABS Take 3 mg by mouth at bedtime.     oxymetazoline (AFRIN) 0.05 % nasal spray Place 2 sprays into both nostrils 2 (two) times daily.     sertraline (ZOLOFT) 50 MG tablet TAKE 1 TABLET BY MOUTH DAILY 30 tablet 5   ibuprofen (ADVIL,MOTRIN) 200 MG tablet Take 600 mg by mouth 2 (two) times daily as needed for headache (pain).     Multiple Vitamins-Minerals (EMERGEN-C VITAMIN C PO) Take 1 tablet by mouth See admin instructions. Take 1 tablet by mouth daily during cold and flu season to boost immune system     Turmeric 500 MG CAPS Take 1 capsule by mouth daily.     Current Facility-Administered Medications  Medication Dose Route Frequency Provider  Last Rate Last Admin   0.9 %  sodium chloride infusion  500 mL Intravenous Once Hubbard Seldon V, DO        Allergies as of 07/12/2023 - Review Complete 07/12/2023  Allergen Reaction Noted   Penicillins Rash 12/29/2016    Family History  Problem Relation Age of Onset   Hypertension Mother    Kidney cancer Mother    Stroke Father    Heart attack Father    Hypertension Father    Prostate cancer Father    Hypertension Sister    Diabetes Sister    Hypertension Sister    Hypertension Maternal Grandmother    Breast cancer Maternal Grandmother    Hypertension Maternal Grandfather    Hypertension  Paternal Grandmother    Hypertension Paternal Grandfather    Tuberculosis Paternal Grandfather    Colon cancer Neg Hx    Colon polyps Neg Hx    Esophageal cancer Neg Hx    Rectal cancer Neg Hx    Stomach cancer Neg Hx     Social History   Socioeconomic History   Marital status: Married    Spouse name: Not on file   Number of children: Not on file   Years of education: Not on file   Highest education level: Not on file  Occupational History   Not on file  Tobacco Use   Smoking status: Some Days    Current packs/day: 0.25    Average packs/day: 0.6 packs/day for 21.7 years (13.7 ttl pk-yrs)    Types: Cigarettes    Start date: 47    Last attempt to quit: 1998   Smokeless tobacco: Never   Tobacco comments:    Smoking social use  Vaping Use   Vaping status: Never Used  Substance and Sexual Activity   Alcohol use: Yes    Alcohol/week: 14.0 standard drinks of alcohol    Types: 14 Glasses of wine per week    Comment: couple beers on the weekend   Drug use: No   Sexual activity: Yes    Birth control/protection: Implant  Other Topics Concern   Not on file  Social History Narrative   Part-time -- real estate office management   1 daughter -- at [employer]; horseback lessons   Hx of child death   Social Determinants of Health   Financial Resource Strain: Not on file  Food Insecurity: Not on file  Transportation Needs: Not on file  Physical Activity: Not on file  Stress: Not on file  Social Connections: Not on file  Intimate Partner Violence: Not on file    Physical Exam: Vital signs in last 24 hours: @BP  137/78   Pulse 82   Temp 98.8 F (37.1 C)   Ht 5\' 6"  (1.676 m)   Wt 196 lb (88.9 kg)   LMP 04/11/2018   SpO2 96%   BMI 31.64 kg/m  GEN: NAD EYE: Sclerae anicteric ENT: MMM CV: Non-tachycardic Pulm: CTA b/l GI: Soft, NT/ND NEURO:  Alert & Oriented x 3   Doristine Locks, DO [facility]   07/12/2023 11:09 AM

## 2023-07-12 NOTE — Progress Notes (Signed)
To pacu, VSS. Report to RN.tb

## 2023-07-12 NOTE — Op Note (Signed)
[facility] Patient Name: Brandy Valdez Procedure Date: 07/12/2023 11:09 AM MRN: [id number] Endoscopist: Doristine Locks , MD, [id number] Age: 57 Referring MD:  Date of Birth: 04/25/1966 Gender: Female Account #: [id number] Procedure:                Colonoscopy Indications:              Screening for colorectal malignant neoplasm, This                            is the patient's first colonoscopy Medicines:                Monitored Anesthesia Care Procedure:                Pre-Anesthesia Assessment:                           - Prior to the procedure, a History and Physical                            was performed, and patient medications and                            allergies were reviewed. The patient's tolerance of                            previous anesthesia was also reviewed. The risks                            and benefits of the procedure and the sedation                            options and risks were discussed with the patient.                            All questions were answered, and informed consent                            was obtained. Prior Anticoagulants: The patient has                            taken no anticoagulant or antiplatelet agents. ASA                            Grade Assessment: II - A patient with mild systemic                            disease. After reviewing the risks and benefits,                            the patient was deemed in satisfactory condition to                            undergo the procedure.  After obtaining informed consent, the colonoscope                            was passed under direct vision. Throughout the                            procedure, the patient's blood pressure, pulse, and                            oxygen saturations were monitored continuously. The                            CF [id number] #[id number] was introduced through the anus                            and advanced to the  the cecum, identified by                            appendiceal orifice and ileocecal valve. The                            colonoscopy was performed without difficulty. The                            patient tolerated the procedure well. The quality                            of the bowel preparation was good. The ileocecal                            valve, appendiceal orifice, and rectum were                            photographed. Scope In: 11:18:06 AM Scope Out: 11:34:35 AM Scope Withdrawal Time: 0 hours 12 minutes 7 seconds  Total Procedure Duration: 0 hours 16 minutes 29 seconds  Findings:                 The perianal and digital rectal examinations were                            normal.                           A 3 mm polyp was found in the cecum. The polyp was                            sessile. The polyp was removed with a cold snare.                            Resection and retrieval were complete. Estimated                            blood loss was minimal.  A few small-mouthed diverticula were found in the                            sigmoid colon and transverse colon.                           The retroflexed view of the distal rectum and anal                            verge was normal and showed no anal or rectal                            abnormalities. Complications:            No immediate complications. Estimated Blood Loss:     Estimated blood loss was minimal. Impression:               - One 3 mm polyp in the cecum, removed with a cold                            snare. Resected and retrieved.                           - Diverticulosis in the sigmoid colon and in the                            transverse colon.                           - The distal rectum and anal verge are normal on                            retroflexion view. Recommendation:           - Patient has a contact number available for                            emergencies.  The signs and symptoms of potential                            delayed complications were discussed with the                            patient. Return to normal activities tomorrow.                            Written discharge instructions were provided to the                            patient.                           - Resume previous diet.                           - Continue present medications.                           -  Await pathology results.                           - Repeat colonoscopy for surveillance based on                            pathology results.                           - Return to GI office PRN. Doristine Locks, MD 07/12/2023 11:43:03 AM

## 2023-07-13 ENCOUNTER — Telehealth: Payer: Self-pay

## 2023-07-13 NOTE — Telephone Encounter (Signed)
  Follow up Call-     07/12/2023   10:19 AM  Call back number  Post procedure Call Back phone  # [phone]  Permission to leave phone message Yes     Patient questions:  Do you have a fever, pain , or abdominal swelling? No. Pain Score  0 *  Have you tolerated food without any problems? Yes.    Have you been able to return to your normal activities? Yes.    Do you have any questions about your discharge instructions: Diet   No. Medications  No. Follow up visit  No.  Do you have questions or concerns about your Care? No.  Actions: * If pain score is 4 or above: No action needed, pain <4.

## 2023-07-14 LAB — SURGICAL PATHOLOGY

## 2023-08-03 DIAGNOSIS — M25562 Pain in left knee: Secondary | ICD-10-CM | POA: Diagnosis not present

## 2023-10-17 ENCOUNTER — Other Ambulatory Visit: Payer: Self-pay | Admitting: Physician Assistant

## 2023-12-12 ENCOUNTER — Other Ambulatory Visit: Payer: Self-pay | Admitting: Physician Assistant

## 2023-12-17 ENCOUNTER — Other Ambulatory Visit: Payer: Self-pay | Admitting: Physician Assistant

## 2023-12-23 ENCOUNTER — Other Ambulatory Visit: Payer: Self-pay | Admitting: Physician Assistant

## 2023-12-23 DIAGNOSIS — R921 Mammographic calcification found on diagnostic imaging of breast: Secondary | ICD-10-CM

## 2023-12-28 ENCOUNTER — Ambulatory Visit
Admission: RE | Admit: 2023-12-28 | Discharge: 2023-12-28 | Disposition: A | Discharge status: Home / Self Care | Payer: BC Managed Care – PPO | Source: Ambulatory Visit | Attending: Physician Assistant | Admitting: Physician Assistant

## 2023-12-28 DIAGNOSIS — R921 Mammographic calcification found on diagnostic imaging of breast: Secondary | ICD-10-CM

## 2023-12-30 ENCOUNTER — Other Ambulatory Visit: Payer: Self-pay | Admitting: Physician Assistant

## 2023-12-30 DIAGNOSIS — R921 Mammographic calcification found on diagnostic imaging of breast: Secondary | ICD-10-CM

## 2024-01-07 ENCOUNTER — Other Ambulatory Visit: Payer: Self-pay | Admitting: Physician Assistant

## 2024-03-03 DIAGNOSIS — M25562 Pain in left knee: Secondary | ICD-10-CM | POA: Insufficient documentation

## 2024-03-14 DIAGNOSIS — M25562 Pain in left knee: Secondary | ICD-10-CM | POA: Diagnosis not present

## 2024-03-22 DIAGNOSIS — M1712 Unilateral primary osteoarthritis, left knee: Secondary | ICD-10-CM | POA: Diagnosis not present

## 2024-03-22 DIAGNOSIS — M25362 Other instability, left knee: Secondary | ICD-10-CM | POA: Diagnosis not present

## 2024-03-23 DIAGNOSIS — M25362 Other instability, left knee: Secondary | ICD-10-CM | POA: Insufficient documentation

## 2024-03-23 DIAGNOSIS — M179 Osteoarthritis of knee, unspecified: Secondary | ICD-10-CM | POA: Insufficient documentation

## 2024-04-05 ENCOUNTER — Other Ambulatory Visit: Payer: Self-pay | Admitting: Physician Assistant

## 2024-04-17 DIAGNOSIS — M25562 Pain in left knee: Secondary | ICD-10-CM | POA: Diagnosis not present

## 2024-04-26 DIAGNOSIS — M1712 Unilateral primary osteoarthritis, left knee: Secondary | ICD-10-CM | POA: Diagnosis not present

## 2024-04-28 DIAGNOSIS — M25562 Pain in left knee: Secondary | ICD-10-CM | POA: Diagnosis not present

## 2024-05-02 DIAGNOSIS — M25562 Pain in left knee: Secondary | ICD-10-CM | POA: Diagnosis not present

## 2024-05-03 DIAGNOSIS — M1712 Unilateral primary osteoarthritis, left knee: Secondary | ICD-10-CM | POA: Diagnosis not present

## 2024-05-09 DIAGNOSIS — M25562 Pain in left knee: Secondary | ICD-10-CM | POA: Diagnosis not present

## 2024-05-10 DIAGNOSIS — M1712 Unilateral primary osteoarthritis, left knee: Secondary | ICD-10-CM | POA: Diagnosis not present

## 2024-06-18 ENCOUNTER — Other Ambulatory Visit: Payer: Self-pay | Admitting: Physician Assistant

## 2024-07-01 ENCOUNTER — Other Ambulatory Visit: Payer: Self-pay | Admitting: Physician Assistant

## 2024-07-03 ENCOUNTER — Ambulatory Visit
Admission: RE | Admit: 2024-07-03 | Discharge: 2024-07-03 | Disposition: A | Discharge status: Home / Self Care | Source: Ambulatory Visit | Attending: Physician Assistant

## 2024-07-03 DIAGNOSIS — R921 Mammographic calcification found on diagnostic imaging of breast: Secondary | ICD-10-CM

## 2024-07-03 DIAGNOSIS — R92333 Mammographic heterogeneous density, bilateral breasts: Secondary | ICD-10-CM | POA: Diagnosis not present

## 2024-07-03 DIAGNOSIS — R928 Other abnormal and inconclusive findings on diagnostic imaging of breast: Secondary | ICD-10-CM | POA: Diagnosis not present

## 2024-07-30 ENCOUNTER — Other Ambulatory Visit: Payer: Self-pay | Admitting: Physician Assistant

## 2024-08-03 ENCOUNTER — Other Ambulatory Visit: Payer: Self-pay | Admitting: Physician Assistant

## 2024-08-03 ENCOUNTER — Encounter: Payer: Self-pay | Admitting: Physician Assistant

## 2024-08-03 ENCOUNTER — Ambulatory Visit: Admitting: Physician Assistant

## 2024-08-03 VITALS — BP 136/84 | HR 83 | Temp 98.1°F | Ht 66.0 in | Wt 201.0 lb

## 2024-08-03 DIAGNOSIS — Z23 Encounter for immunization: Secondary | ICD-10-CM

## 2024-08-03 DIAGNOSIS — R7989 Other specified abnormal findings of blood chemistry: Secondary | ICD-10-CM | POA: Diagnosis not present

## 2024-08-03 DIAGNOSIS — I10 Essential (primary) hypertension: Secondary | ICD-10-CM | POA: Diagnosis not present

## 2024-08-03 DIAGNOSIS — F419 Anxiety disorder, unspecified: Secondary | ICD-10-CM | POA: Diagnosis not present

## 2024-08-03 DIAGNOSIS — R7309 Other abnormal glucose: Secondary | ICD-10-CM

## 2024-08-03 MED ORDER — LOSARTAN POTASSIUM 50 MG PO TABS: 50.0000 mg | ORAL_TABLET | Freq: Every day | ORAL | 1 refills | Status: AC

## 2024-08-03 MED ORDER — SERTRALINE HCL 50 MG PO TABS: 50.0000 mg | ORAL_TABLET | Freq: Every day | ORAL | 1 refills | Status: AC

## 2024-08-03 MED ORDER — AMLODIPINE BESYLATE 10 MG PO TABS: 10.0000 mg | ORAL_TABLET | Freq: Every day | ORAL | 1 refills | Status: AC

## 2024-08-03 NOTE — Patient Instructions (Signed)
 Wt Readings from Last 3 Encounters:  08/03/24 201 lb (91.2 kg)  07/12/23 196 lb (88.9 kg)  06/28/23 196 lb (88.9 kg)

## 2024-08-03 NOTE — Progress Notes (Signed)
 Brandy Valdez is a 58 y.o. female here for a follow up of a pre-existing problem.  History of Present Illness:   Chief Complaint  Patient presents with   Medical Management of Chronic Issues    Pt here for f/u Hypertension, needs refills.    Discussed the use of AI scribe software for clinical note transcription with the patient, who gave verbal consent to proceed.  History of Present Illness   Brandy Valdez is a 58 year old female who presents for follow-up of chronic medical issues.  Her liver labs were previously elevated. She is concerned about her blood sugar levels and is making dietary changes to prevent diabetes, influenced by her partner Michael's diabetes management. She has both altered her eating habits, incorporating more vegetables and using an air fryer to prepare meals.  She is experiencing difficulty losing weight despite feeling generally well. She monitors her blood pressure and mental health, both of which are stable. She is on 50 mg of an unspecified medication for mental health, which she finds sufficient.  No swelling in her legs, noting that they are the smallest part of her body. She has also reduced smoking, citing a lack of time as a contributing factor.   She is taking amlodipine  10 mg daily, losartan  50 mg daily and Zoloft  50 mg daily.      Past Medical History:  Diagnosis Date   Anxiety    Atypical mole 04/15/2005   Center Upper Back   Atypical mole 04/15/2005   Left Low Back   Hypertension    Squamous cell carcinoma of skin 01/31/2018   Left Cheek (Keratoacanthoma) (MOH's)     Social History   Tobacco Use   Smoking status: Some Days    Current packs/day: 0.25    Average packs/day: 0.6 packs/day for 22.8 years (13.9 ttl pk-yrs)    Types: Cigarettes    Start date: 89    Last attempt to quit: 1998   Smokeless tobacco: Never   Tobacco comments:    Smoking social use  Vaping Use   Vaping status: Never Used  Substance Use Topics    Alcohol use: Yes    Alcohol/week: 14.0 standard drinks of alcohol    Types: 14 Glasses of wine per week    Comment: couple beers on the weekend   Drug use: No    Past Surgical History:  Procedure Laterality Date   ESSURE TUBAL LIGATION Bilateral 2001    Family History  Problem Relation Age of Onset   Hypertension Mother    Kidney cancer Mother    Stroke Father    Heart attack Father    Hypertension Father    Prostate cancer Father    Hypertension Sister    Diabetes Sister    Hypertension Sister    Hypertension Maternal Grandmother    Breast cancer Maternal Grandmother    Hypertension Maternal Grandfather    Hypertension Paternal Grandmother    Hypertension Paternal Grandfather    Tuberculosis Paternal Grandfather    Colon cancer Neg Hx    Colon polyps Neg Hx    Esophageal cancer Neg Hx    Rectal cancer Neg Hx    Stomach cancer Neg Hx     Allergies  Allergen Reactions   Penicillins Rash    Has patient had a PCN reaction causing immediate rash, facial/tongue/throat swelling, SOB or lightheadedness with hypotension: Yes Has patient had a PCN reaction causing severe rash involving mucus membranes or skin necrosis: No Has patient  had a PCN reaction that required hospitalization No Has patient had a PCN reaction occurring within the last 10 years: No If all of the above answers are NO, then may proceed with Cephalosporin use.    Current Medications:   Current Outpatient Medications:    ibuprofen (ADVIL,MOTRIN) 200 MG tablet, Take 600 mg by mouth 2 (two) times daily as needed for headache (pain)., Disp: , Rfl:    Melatonin 3 MG TABS, Take 3 mg by mouth at bedtime., Disp: , Rfl:    Multiple Vitamins-Minerals (EMERGEN-C VITAMIN C PO), Take 1 tablet by mouth See admin instructions. Take 1 tablet by mouth daily during cold and flu season to boost immune system, Disp: , Rfl:    oxymetazoline (AFRIN) 0.05 % nasal spray, Place 2 sprays into both nostrils 2 (two) times daily.,  Disp: , Rfl:    Turmeric 500 MG CAPS, Take 1 capsule by mouth daily., Disp: , Rfl:    amLODipine  (NORVASC ) 10 MG tablet, Take 1 tablet (10 mg total) by mouth daily., Disp: 90 tablet, Rfl: 1   losartan  (COZAAR ) 50 MG tablet, Take 1 tablet (50 mg total) by mouth daily., Disp: 90 tablet, Rfl: 1   sertraline  (ZOLOFT ) 50 MG tablet, Take 1 tablet (50 mg total) by mouth daily., Disp: 90 tablet, Rfl: 1   Review of Systems:   Negative unless otherwise specified per HPI.  Vitals:   Vitals:   08/03/24 0809  BP: 136/84  Pulse: 83  Temp: 98.1 F (36.7 C)  TempSrc: Temporal  SpO2: 96%  Weight: 201 lb (91.2 kg)  Height: 5' 6 (1.676 m)     Body mass index is 32.44 kg/m.  Physical Exam:   Physical Exam Vitals and nursing note reviewed.  Constitutional:      General: She is not in acute distress.    Appearance: She is well-developed. She is not ill-appearing or toxic-appearing.  Cardiovascular:     Rate and Rhythm: Normal rate and regular rhythm.     Pulses: Normal pulses.     Heart sounds: Normal heart sounds, S1 normal and S2 normal.  Pulmonary:     Effort: Pulmonary effort is normal.     Breath sounds: Normal breath sounds.  Skin:    General: Skin is warm and dry.  Neurological:     Mental Status: She is alert.     GCS: GCS eye subscore is 4. GCS verbal subscore is 5. GCS motor subscore is 6.  Psychiatric:        Speech: Speech normal.        Behavior: Behavior normal. Behavior is cooperative.     Assessment and Plan:   Assessment and Plan    Essential hypertension Blood pressure well-controlled with current regimen. - Continue current antihypertensive medication regimen of amlodipine  10 mg and losartan  50 mg.  Abnormal liver function tests Previous liver function tests were elevated. - Recheck liver function tests at next visit.  Elevated glucose Will recheck Hemoglobin A1c at follow up  Continue efforts at healthy lifestyle  Anxiety Well controlled Continue  Zoloft  50 mg daily   She was told to follow up in 3 months for Comprehensive Physical Exam (CPE) preventive care annual visit          Lucie Buttner, PA-C
